# Patient Record
Sex: Female | Born: 1995 | Race: White | Hispanic: No | Marital: Married | Smoking: Former smoker
Health system: Southern US, Community
[De-identification: ages and names within clinical notes are randomized; demographics above are authoritative.]

## PROBLEM LIST (undated history)

## (undated) ENCOUNTER — Inpatient Hospital Stay: Payer: Self-pay

## (undated) DIAGNOSIS — E7800 Pure hypercholesterolemia, unspecified: Secondary | ICD-10-CM

## (undated) DIAGNOSIS — K760 Fatty (change of) liver, not elsewhere classified: Secondary | ICD-10-CM

## (undated) DIAGNOSIS — I951 Orthostatic hypotension: Secondary | ICD-10-CM

## (undated) DIAGNOSIS — F32A Depression, unspecified: Secondary | ICD-10-CM

## (undated) DIAGNOSIS — L309 Dermatitis, unspecified: Secondary | ICD-10-CM

## (undated) DIAGNOSIS — R519 Headache, unspecified: Secondary | ICD-10-CM

## (undated) DIAGNOSIS — R011 Cardiac murmur, unspecified: Secondary | ICD-10-CM

## (undated) DIAGNOSIS — F329 Major depressive disorder, single episode, unspecified: Secondary | ICD-10-CM

## (undated) DIAGNOSIS — R51 Headache: Secondary | ICD-10-CM

## (undated) DIAGNOSIS — J45909 Unspecified asthma, uncomplicated: Secondary | ICD-10-CM

## (undated) DIAGNOSIS — F988 Other specified behavioral and emotional disorders with onset usually occurring in childhood and adolescence: Secondary | ICD-10-CM

## (undated) HISTORY — PX: TONSILLECTOMY AND ADENOIDECTOMY: SUR1326

## (undated) HISTORY — DX: Headache, unspecified: R51.9

## (undated) HISTORY — DX: Pure hypercholesterolemia, unspecified: E78.00

## (undated) HISTORY — DX: Depression, unspecified: F32.A

## (undated) HISTORY — PX: ADENOIDECTOMY: SUR15

## (undated) HISTORY — DX: Headache: R51

## (undated) HISTORY — DX: Major depressive disorder, single episode, unspecified: F32.9

---
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
## 2015-01-01 ENCOUNTER — Encounter: Payer: Self-pay | Admitting: Certified Nurse Midwife

## 2015-01-01 DIAGNOSIS — M549 Dorsalgia, unspecified: Secondary | ICD-10-CM | POA: Diagnosis present

## 2015-01-01 DIAGNOSIS — O9989 Other specified diseases and conditions complicating pregnancy, childbirth and the puerperium: Secondary | ICD-10-CM

## 2015-01-01 DIAGNOSIS — O2303 Infections of kidney in pregnancy, third trimester: Secondary | ICD-10-CM | POA: Diagnosis present

## 2015-01-01 DIAGNOSIS — O99891 Other specified diseases and conditions complicating pregnancy: Secondary | ICD-10-CM | POA: Diagnosis present

## 2015-01-01 LAB — CBC
HCT: 34.4 % — ABNORMAL LOW (ref 35.0–47.0)
HEMOGLOBIN: 11.7 g/dL — AB (ref 12.0–16.0)
MCH: 29 pg (ref 26.0–34.0)
MCHC: 34 g/dL (ref 32.0–36.0)
MCV: 85.2 fL (ref 80.0–100.0)
Platelets: 227 10*3/uL (ref 150–440)
RBC: 4.03 MIL/uL (ref 3.80–5.20)
RDW: 12.4 % (ref 11.5–14.5)
WBC: 12.8 10*3/uL — ABNORMAL HIGH (ref 3.6–11.0)

## 2015-01-01 LAB — BASIC METABOLIC PANEL
Anion gap: 10 (ref 5–15)
BUN: 6 mg/dL (ref 6–20)
CHLORIDE: 106 mmol/L (ref 101–111)
CO2: 21 mmol/L — ABNORMAL LOW (ref 22–32)
Calcium: 9.4 mg/dL (ref 8.9–10.3)
Creatinine, Ser: 0.48 mg/dL (ref 0.44–1.00)
GFR calc Af Amer: >60 mL/min (ref >60)
GFR calc non Af Amer: >60 mL/min (ref >60)
GLUCOSE: 75 mg/dL (ref 65–99)
POTASSIUM: 3.7 mmol/L (ref 3.5–5.1)
SODIUM: 137 mmol/L (ref 135–145)

## 2015-01-01 MED ORDER — AZTREONAM 1 G IJ SOLR
1.0000 g | Freq: Three times a day (TID) | INTRAMUSCULAR | Status: DC
  Filled 2015-01-01 (×4): qty 1

## 2015-01-01 MED ORDER — OXYCODONE-ACETAMINOPHEN 5-325 MG PO TABS: 1.0000 | ORAL_TABLET | Freq: Four times a day (QID) | ORAL | Status: DC | PRN

## 2015-01-01 MED ORDER — AZTREONAM 1 G IJ SOLR
1.0000 g | Freq: Three times a day (TID) | INTRAMUSCULAR | Status: DC
  Filled 2015-01-01 (×5): qty 1

## 2015-01-01 MED ORDER — DEXTROSE 5 % IV SOLN
1.0000 g | Freq: Three times a day (TID) | INTRAVENOUS | Status: DC
  Administered 2015-01-01 – 2015-01-02 (×4): 1 g via INTRAVENOUS
  Filled 2015-01-01 (×7): qty 1

## 2015-01-01 MED ORDER — LACTATED RINGERS IV SOLN
INTRAVENOUS | Status: DC
  Administered 2015-01-01 (×2): via INTRAVENOUS
  Administered 2015-01-01: 125 mL/h via INTRAVENOUS
  Administered 2015-01-02 (×2): via INTRAVENOUS
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
## 2015-01-01 NOTE — Progress Notes (Addendum)
Patient transferred from L&D; report given via telephone after patient was dropped off.  IV located in left hand but not infusing since transfer.  IV pole was located outside patient's door and IV tubing was clamped shut with fluid and antibiotic bags hanging on the bed pole.  IV site required flushing as it was clotted and occluded when hooked up to the IV pump.  Flushing occurred after two attempts and infusion of LR @ 125/hr was started.  Patient is not listed as an observation patient for antepartum.  Orders still show labor & delivery status after transfer and there are no antepartum orders.  L&D nurse was made aware of this during report.
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
## 2015-01-01 NOTE — Progress Notes (Signed)
Report given to Antepartum RN.

## 2015-01-01 NOTE — Progress Notes (Signed)
Pt. Transferred to antepartum for anbx therapy, via WC with FOB at the bedside. Fetal monitoring complete, IV located in left hand with LR infusing at 125 ml/hr, one dose of Azactam 1g given at 0149.  Pt. Up to BR at will. Voiding without problems.

## 2015-01-02 DIAGNOSIS — O2303 Infections of kidney in pregnancy, third trimester: Secondary | ICD-10-CM | POA: Diagnosis not present

## 2015-01-02 LAB — URINE CULTURE: Culture: >=100000

## 2015-01-02 MED ORDER — NITROFURANTOIN MONOHYD MACRO 100 MG PO CAPS: 100.0000 mg | ORAL_CAPSULE | Freq: Two times a day (BID) | ORAL | Status: DC

## 2015-01-02 NOTE — Progress Notes (Signed)
FHT 144

## 2015-01-02 NOTE — Progress Notes (Signed)
All discharge instructions given to patient and she voices understanding of all instructions given.   Iv d/c'd pt tolerated well, site benign. She will make her f/u appt in 1 wk with [facility]. Prescription given.  Patient discharged home with significant other escorted out by auxillary

## 2015-01-02 NOTE — Progress Notes (Deleted)
HR 144

## 2015-01-02 NOTE — Discharge Summary (Signed)
Physician Discharge Summary  Patient ID: Madison SalvoChelsea N Bergquist MRN: [id number] DOB/AGE: 03/24/1995 19 y.o.  Admit date: 12/31/2014 Discharge date: 01/02/2015  Admission Diagnoses:  Discharge Diagnoses:  Active Problems:   Pyelonephritis affecting pregnancy in third trimester, antepartum   Back pain affecting pregnancy in third trimester   Discharged Condition: stable  Hospital Course: IV antibiotics Aztreonam x 24 hours, Lactated Ringers, Percocet for pain  Consults: None  Significant Diagnostic Studies: labs: CBC 01/02/15: WBC elevated at 12.8, Hgb mildly decreased at 11.7, Hct mildly decreased at 34.4. UA 01/01/15: hazy, many bacteria, 3+ Leukocytes, Protein 100, WBC too numerous to count. Results of Sensitivity pending   Discharge Exam: Blood pressure 100/60, pulse 92, temperature 98.2 F (36.8 C), temperature source Oral, resp. rate 20, last menstrual period 05/17/2014, SpO2 98 %.  General exam: Alert and oriented x3 in NAD No CVA tenderness + fetal movement  Disposition: 01-Home or Self Care     Medication List    TAKE these medications        cyclobenzaprine 10 MG tablet  Commonly known as:  FLEXERIL  Take 1 tablet (10 mg total) by mouth every 8 (eight) hours as needed for muscle spasms.     multivitamin-prenatal 27-0.8 MG Tabs tablet  Take 1 tablet by mouth daily at 12 noon.     nitrofurantoin (macrocrystal-monohydrate) 100 MG capsule  Commonly known as:  MACROBID  Take 1 capsule (100 mg total) by mouth 2 (two) times daily.           Follow-up Information    Follow up with GUTIERREZ, COLLEEN, CNM In 1 week.   Specialty:  Certified Nurse Midwife   Why:  for follow up   Contact information:   [address] [place] [address] [phone]       Signed: Tresea MallGLEDHILL,Dalya Maselli 01/02/2015, 10:30 AM          This patient and plan were discussed with Dr Tiburcio PeaHarris 01/02/15

## 2015-01-02 NOTE — Discharge Instructions (Signed)

## 2015-01-06 NOTE — L&D Delivery Note (Signed)
Delivery Note At  a viable and healthy female was delivered via  (Presentation: ;  ).  APGAR:8 ,8 ; weight  .  7#8oz Placenta status: delivered intact with 3 vessel  Cord:  with the following complications: NCx1, loose- infant delivered through it  Anesthesia:  Epidural-just placed Episiotomy:  none Lacerations:  bilateral perilabial  Suture Repair: 3.0 vicryl rapide Est. Blood Loss (mL):  500  Mom to postpartum.  Baby to Couplet care / Skin to Skin.  Imunique Samad NIKE, CNM 02/27/2015, 3:38 AM

## 2015-02-06 ENCOUNTER — Ambulatory Visit (INDEPENDENT_AMBULATORY_CARE_PROVIDER_SITE_OTHER): Payer: Medicaid Other | Admitting: Obstetrics and Gynecology

## 2015-02-06 ENCOUNTER — Encounter: Payer: Self-pay | Admitting: Obstetrics and Gynecology

## 2015-02-06 VITALS — BP 122/94 | HR 98 | Wt 166.7 lb

## 2015-02-06 DIAGNOSIS — Z331 Pregnant state, incidental: Secondary | ICD-10-CM

## 2015-02-06 LAB — POCT URINALYSIS DIPSTICK
BILIRUBIN UA: NEGATIVE
Blood, UA: NEGATIVE
GLUCOSE UA: NEGATIVE
KETONES UA: NEGATIVE
NITRITE UA: NEGATIVE
PH UA: 6
Spec Grav, UA: 1.01
Urobilinogen, UA: 0.2

## 2015-02-06 NOTE — Progress Notes (Signed)
NOB transfer from [facility] 37w 6d Pt c/o b feet swelling, "turn purple" sometimes,

## 2015-02-06 NOTE — Patient Instructions (Signed)

## 2015-02-06 NOTE — Progress Notes (Signed)
ROB-doing well, occasional BHC, labor precautions discussed

## 2015-02-13 ENCOUNTER — Encounter: Payer: Self-pay | Admitting: Obstetrics and Gynecology

## 2015-02-13 ENCOUNTER — Encounter: Payer: Medicaid Other | Admitting: Obstetrics and Gynecology

## 2015-02-13 ENCOUNTER — Ambulatory Visit (INDEPENDENT_AMBULATORY_CARE_PROVIDER_SITE_OTHER): Payer: Medicaid Other | Admitting: Obstetrics and Gynecology

## 2015-02-13 ENCOUNTER — Other Ambulatory Visit: Payer: Self-pay | Admitting: Obstetrics and Gynecology

## 2015-02-13 VITALS — BP 119/78 | HR 93 | Wt 166.7 lb

## 2015-02-13 DIAGNOSIS — Z331 Pregnant state, incidental: Secondary | ICD-10-CM

## 2015-02-13 LAB — POCT URINALYSIS DIPSTICK
BILIRUBIN UA: NEGATIVE
Blood, UA: NEGATIVE
Glucose, UA: NEGATIVE
KETONES UA: NEGATIVE
Nitrite, UA: NEGATIVE
PH UA: 7
PROTEIN UA: NEGATIVE
SPEC GRAV UA: 1.01
Urobilinogen, UA: 0.2

## 2015-02-13 NOTE — Progress Notes (Signed)
ROB-pt denies complaints

## 2015-02-13 NOTE — Progress Notes (Signed)
ROB- labor precautions discussed, and postdate care reviewed.

## 2015-02-15 ENCOUNTER — Other Ambulatory Visit: Payer: Self-pay | Admitting: Obstetrics and Gynecology

## 2015-02-15 DIAGNOSIS — O480 Post-term pregnancy: Secondary | ICD-10-CM

## 2015-02-17 LAB — STREP GP B NAA: STREP GROUP B AG: NEGATIVE

## 2015-02-17 LAB — GC/CHLAMYDIA PROBE AMP
CHLAMYDIA, DNA PROBE: NEGATIVE
NEISSERIA GONORRHOEAE BY PCR: NEGATIVE

## 2015-02-20 ENCOUNTER — Encounter: Payer: Self-pay | Admitting: Obstetrics and Gynecology

## 2015-02-20 ENCOUNTER — Encounter: Payer: Self-pay | Admitting: *Deleted

## 2015-02-20 ENCOUNTER — Ambulatory Visit (INDEPENDENT_AMBULATORY_CARE_PROVIDER_SITE_OTHER): Payer: Medicaid Other | Admitting: Obstetrics and Gynecology

## 2015-02-20 VITALS — BP 130/85 | HR 106 | Wt 167.6 lb

## 2015-02-20 DIAGNOSIS — Z331 Pregnant state, incidental: Secondary | ICD-10-CM

## 2015-02-20 LAB — POCT URINALYSIS DIPSTICK
Bilirubin, UA: NEGATIVE
Blood, UA: NEGATIVE
Glucose, UA: NEGATIVE
KETONES UA: NEGATIVE
Nitrite, UA: NEGATIVE
PH UA: 6.5
PROTEIN UA: NEGATIVE
SPEC GRAV UA: 1.01
Urobilinogen, UA: 0.2

## 2015-02-20 NOTE — Progress Notes (Signed)
ROB-pt c/o low back pain

## 2015-02-20 NOTE — Progress Notes (Signed)
ROB- doing well. 

## 2015-02-22 ENCOUNTER — Ambulatory Visit (INDEPENDENT_AMBULATORY_CARE_PROVIDER_SITE_OTHER): Payer: Medicaid Other

## 2015-02-22 ENCOUNTER — Ambulatory Visit (INDEPENDENT_AMBULATORY_CARE_PROVIDER_SITE_OTHER): Payer: Medicaid Other | Admitting: Obstetrics and Gynecology

## 2015-02-22 ENCOUNTER — Encounter: Payer: Self-pay | Admitting: Obstetrics and Gynecology

## 2015-02-22 VITALS — BP 127/75 | HR 93 | Wt 169.3 lb

## 2015-02-22 DIAGNOSIS — Z3493 Encounter for supervision of normal pregnancy, unspecified, third trimester: Secondary | ICD-10-CM | POA: Diagnosis not present

## 2015-02-22 DIAGNOSIS — O480 Post-term pregnancy: Secondary | ICD-10-CM

## 2015-02-22 LAB — POCT URINALYSIS DIPSTICK
Bilirubin, UA: NEGATIVE
Glucose, UA: NEGATIVE
KETONES UA: NEGATIVE
NITRITE UA: NEGATIVE
PH UA: 6.5
RBC UA: NEGATIVE
Spec Grav, UA: 1.02
Urobilinogen, UA: 0.2

## 2015-02-22 NOTE — Progress Notes (Signed)
Post dates- u/s and nst.

## 2015-02-22 NOTE — Progress Notes (Signed)
Indications:  Growth/AFI/BPP for Post Dates Findings:  Singleton intrauterine pregnancy is visualized with FHR at 131 BPM. Biometrics give an (U/S) Gestational age of [redacted] weeks 2 days, and an (U/S) EDD of 03/13/15; this DOES NOT correlate with the clinically established EDD of 02/21/15.  Fetal presentation is vertex, spine anterior.  EFW: 3164 grams ( 7 lbs. 0 oz. ) 29th percentile for growth Mayford Knife). Placenta: Posterior, grade 3. AFI: Adequate at 9.1 cm. BPP: 6/8  (no fetal breathing movements seen).  Anatomic survey of the stomach, bladder and kidneys is completed and appears WNL.     Impression: 1. 37 week 2 day Viable Singleton Intrauterine pregnancy by U/S. 2. (U/S) EDD IS NOT consistent with Clinically established (LMP) EDD of 02/21/15. 3. EFW is in the 29th percentile for growth Mayford Knife) at 3164 grams ( 7 lbs. 0 oz. ). 37 weeks 2 days ( should be 40 1/7). 4. BPP 6/8. No fetal breathing movements seen. 5. AFI is adequate at 9.1 cm.   NST performed today was reviewed and was found to be reactive. Baseline125-135 with Moderate variability; No decels noted.  Continue recommended antenatal testing and prenatal care.  Plan IOL 2/21 if not delivered by then

## 2015-02-23 ENCOUNTER — Observation Stay
Admission: EM | Admit: 2015-02-23 | Discharge: 2015-02-23 | Disposition: A | Discharge status: Home / Self Care | Payer: Medicaid Other | Attending: Obstetrics and Gynecology | Admitting: Obstetrics and Gynecology

## 2015-02-23 ENCOUNTER — Encounter: Payer: Self-pay | Admitting: *Deleted

## 2015-02-23 DIAGNOSIS — M549 Dorsalgia, unspecified: Secondary | ICD-10-CM | POA: Diagnosis present

## 2015-02-23 DIAGNOSIS — Z3A40 40 weeks gestation of pregnancy: Secondary | ICD-10-CM | POA: Insufficient documentation

## 2015-02-23 NOTE — Progress Notes (Signed)
Galen Manila CNM given update- CNM states that pt maybe d/c'ed home with Labor Precautions. Note pt scheduled for Induction of Labor this Tues.

## 2015-02-23 NOTE — Progress Notes (Signed)
Perineum, bed chuk pad appears dry. Nitrazine paper test negative to labia and inner labia

## 2015-02-23 NOTE — OB Triage Note (Signed)
Pt reports regular u/c's as back pain since approx 1900 today, every 5 min- pain occurring in lower back

## 2015-02-23 NOTE — Progress Notes (Signed)
Pt and spouse reviewed d/c instructions with RN, all concerns and questions discussed. Pt and spouse aware of impending [facility] for Induction of Labor Tues am 0800. Pt and spouse left [facility] walking with belongings.

## 2015-02-26 ENCOUNTER — Inpatient Hospital Stay
Admission: RE | Admit: 2015-02-26 | Discharge: 2015-02-28 | DRG: 775 | Disposition: A | Discharge status: Home / Self Care | Payer: Medicaid Other | Source: Ambulatory Visit | Attending: Obstetrics and Gynecology | Admitting: Obstetrics and Gynecology

## 2015-02-26 DIAGNOSIS — Z803 Family history of malignant neoplasm of breast: Secondary | ICD-10-CM | POA: Diagnosis not present

## 2015-02-26 DIAGNOSIS — Z3A40 40 weeks gestation of pregnancy: Secondary | ICD-10-CM

## 2015-02-26 DIAGNOSIS — Z880 Allergy status to penicillin: Secondary | ICD-10-CM | POA: Diagnosis not present

## 2015-02-26 DIAGNOSIS — Z87891 Personal history of nicotine dependence: Secondary | ICD-10-CM

## 2015-02-26 DIAGNOSIS — O365930 Maternal care for other known or suspected poor fetal growth, third trimester, not applicable or unspecified: Secondary | ICD-10-CM | POA: Diagnosis present

## 2015-02-26 DIAGNOSIS — O480 Post-term pregnancy: Secondary | ICD-10-CM | POA: Diagnosis present

## 2015-02-26 DIAGNOSIS — Z8041 Family history of malignant neoplasm of ovary: Secondary | ICD-10-CM | POA: Diagnosis not present

## 2015-02-26 DIAGNOSIS — O9081 Anemia of the puerperium: Secondary | ICD-10-CM | POA: Diagnosis not present

## 2015-02-26 LAB — CBC
HEMATOCRIT: 33.4 % — AB (ref 35.0–47.0)
Hemoglobin: 11 g/dL — ABNORMAL LOW (ref 12.0–16.0)
MCH: 26 pg (ref 26.0–34.0)
MCHC: 33 g/dL (ref 32.0–36.0)
MCV: 78.7 fL — ABNORMAL LOW (ref 80.0–100.0)
PLATELETS: 230 10*3/uL (ref 150–440)
RBC: 4.24 MIL/uL (ref 3.80–5.20)
RDW: 13.9 % (ref 11.5–14.5)
WBC: 8 10*3/uL (ref 3.6–11.0)

## 2015-02-26 LAB — TYPE AND SCREEN
ABO/RH(D): A POS
ANTIBODY SCREEN: NEGATIVE

## 2015-02-26 LAB — CHLAMYDIA/NGC RT PCR (ARMC ONLY)
CHLAMYDIA TR: NOT DETECTED
N GONORRHOEAE: NOT DETECTED

## 2015-02-26 MED ORDER — MISOPROSTOL 25 MCG QUARTER TABLET
50.0000 ug | ORAL_TABLET | ORAL | Status: DC
Start: 2015-02-26 — End: 2015-02-27
  Administered 2015-02-26 (×3): 50 ug via VAGINAL
  Filled 2015-02-26 (×2): qty 0.5
  Filled 2015-02-26: qty 1

## 2015-02-26 MED ORDER — LIDOCAINE HCL (PF) 1 % IJ SOLN
INTRAMUSCULAR | Status: AC
  Filled 2015-02-26: qty 30

## 2015-02-26 MED ORDER — AMMONIA AROMATIC IN INHA
RESPIRATORY_TRACT | Status: AC
  Filled 2015-02-26: qty 10

## 2015-02-26 MED ORDER — MISOPROSTOL 200 MCG PO TABS
ORAL_TABLET | ORAL | Status: AC
  Filled 2015-02-26: qty 4

## 2015-02-26 MED ORDER — OXYTOCIN 10 UNIT/ML IJ SOLN
INTRAMUSCULAR | Status: AC
  Filled 2015-02-26: qty 2

## 2015-02-26 MED ORDER — LIDOCAINE HCL (PF) 1 % IJ SOLN
30.0000 mL | INTRAMUSCULAR | Status: DC | PRN
Start: 2015-02-26 — End: 2015-02-27
  Administered 2015-02-27: 30 mL via SUBCUTANEOUS

## 2015-02-26 MED ORDER — LACTATED RINGERS IV SOLN
INTRAVENOUS | Status: DC
  Administered 2015-02-26 – 2015-02-27 (×3): via INTRAVENOUS

## 2015-02-26 MED ORDER — LACTATED RINGERS IV SOLN
500.0000 mL | INTRAVENOUS | Status: DC | PRN
  Administered 2015-02-27: 500 mL via INTRAVENOUS

## 2015-02-26 MED ORDER — FENTANYL CITRATE (PF) 100 MCG/2ML IJ SOLN
50.0000 ug | INTRAMUSCULAR | Status: DC | PRN
  Administered 2015-02-26 – 2015-02-27 (×2): 100 ug via INTRAVENOUS
  Filled 2015-02-26 (×2): qty 2

## 2015-02-26 MED ORDER — ACETAMINOPHEN 325 MG PO TABS
650.0000 mg | ORAL_TABLET | ORAL | Status: DC | PRN
  Administered 2015-02-27: 650 mg via ORAL
  Filled 2015-02-26: qty 2

## 2015-02-26 MED ORDER — ONDANSETRON HCL 4 MG/2ML IJ SOLN
4.0000 mg | Freq: Four times a day (QID) | INTRAMUSCULAR | Status: DC | PRN
  Filled 2015-02-26: qty 2

## 2015-02-26 MED ORDER — OXYTOCIN BOLUS FROM INFUSION
500.0000 mL | INTRAVENOUS | Status: DC
Start: 2015-02-26 — End: 2015-02-27

## 2015-02-26 MED ORDER — TERBUTALINE SULFATE 1 MG/ML IJ SOLN: 0.2500 mg | Freq: Once | INTRAMUSCULAR | Status: DC | PRN

## 2015-02-26 MED ORDER — OXYTOCIN 40 UNITS IN LACTATED RINGERS INFUSION - SIMPLE MED
2.5000 [IU]/h | INTRAVENOUS | Status: DC
  Filled 2015-02-26: qty 1000

## 2015-02-26 MED ORDER — CITRIC ACID-SODIUM CITRATE 334-500 MG/5ML PO SOLN: 30.0000 mL | ORAL | Status: DC | PRN

## 2015-02-26 MED ORDER — OXYTOCIN 40 UNITS IN LACTATED RINGERS INFUSION - SIMPLE MED
1.0000 m[IU]/min | INTRAVENOUS | Status: DC
  Administered 2015-02-26: 1 m[IU]/min via INTRAVENOUS
  Administered 2015-02-27: 666 m[IU]/min via INTRAVENOUS

## 2015-02-26 MED ORDER — SODIUM CHLORIDE FLUSH 0.9 % IV SOLN
INTRAVENOUS | Status: AC
  Filled 2015-02-26: qty 10

## 2015-02-26 NOTE — Progress Notes (Signed)
Madison Jenkins is a 20 y.o. G2P0010 at [redacted]w[redacted]d by LMP admitted for induction of labor due to Post dates. Due date 02/21/15. And SGA  Subjective: Reports some low back pain with contractions  Objective: BP 115/68 mmHg  Pulse 81  Temp(Src) 98.6 F (37 C) (Oral)  Resp 15  Ht  (1.727 m)  Wt 76.658 kg (169 lb)  BMI 25.70 kg/m2  LMP 05/17/2014   Total I/O In: 289.6 [I.V.:289.6] Out: -   FHT:  FHR: 121 bpm, variability: moderate,  accelerations:  Present,  decelerations:  Absent UC:   irregular, every 5-7 minutes, mild to palpation SVE:   Dilation: 1 Effacement (%): 60 Station: -1 Exam by:: JCM  Labs: Lab Results  Component Value Date   WBC 8.0 02/26/2015   HGB 11.0* 02/26/2015   HCT 33.4* 02/26/2015   MCV 78.7* 02/26/2015   PLT 230 02/26/2015    Assessment / Plan: Cytotec IOL, progressing well  Labor: Progressing normally Preeclampsia:  labs stable Fetal Wellbeing:  Category II Pain Control:  Labor support without medications I/D:  n/a Anticipated MOD:  NSVD  Devario Bucklew N Edon Hoadley, CNM 02/26/2015, 1:25 PM

## 2015-02-26 NOTE — Progress Notes (Signed)
Report received from Vallarie Mare, [place]. In room to assume care, bedside report completed. Pt lying in bed awake watching TV, s/o at side, supportive. Introduced self to pt and s/o. Discussed plan for shift. Will place Cytotec dose #3, repeat cervical exam and call CNM to update on progress. Pt asked about ambulating in hallway if not much change on exam. Questions addressed, understanding and agreement with plan verbalized

## 2015-02-26 NOTE — Progress Notes (Signed)
Spoke with Melody S. CNM, provided update to pt current labor status, cervix 2.5cm, still posterior and pt having difficulty tolerating exam. Cytotec dose #3 placed, pt asked about ambulating. Per CNM, pt may ambulate in an hour from time Cytotec placed, repeat cervical exam after ambulating and if cervix unchanged, will allow pt to rest tonight and resume induction process in AM, otherwise if change noted, start Pitocin per protocol.

## 2015-02-26 NOTE — Progress Notes (Signed)
2021 - Pt requesting to go to restroom for void and then ambulate since she already feels better sitting upright in bed, EFM removed, FHT reassuring, Category 1 tracing present, discussed s/s that pt should report if noted while ambulating. Pt provided with cup of ice chips, s/o present at side, pt into restroom and then will leave room to ambulate x30-45 mins.

## 2015-02-26 NOTE — H&P (Signed)
Obstetric History and Physical  Madison Jenkins is a 20 y.o. G2P0010 with IUP at [redacted]w[redacted]d presenting with IOL oders. Patient states she has been having  none contractions, none vaginal bleeding, intact membranes, with active fetal movement.    Prenatal Course Source of Care: [facility]  Pregnancy complications or risks:postdates, SGA,   Prenatal labs and studies: ABO, Rh: --/Positive/-- 08/09/2022 0000) Antibody: Negative 2022/08/09 0000) Rubella: Immune 08/09/2022 0000) RPR: Nonreactive (11/23 0000)  HBsAg: Negative 09-Aug-2022 0000)  HIV: Non-reactive (11/23 0000)  ZOX:[id number] (02/08 1500) 1 hr Glucola  normal Genetic screening normal Anatomy US normal  Past Medical History  Diagnosis Date  . Asthma   . Eczema   . Orthostatic hypotension   . Murmur   . ADD (attention deficit disorder)     ADHD as per pt    Past Surgical History  Procedure Laterality Date  . Tonsillectomy and adenoidectomy      OB History  Gravida Para Term Preterm AB SAB TAB Ectopic Multiple Living  2 0   1 1        # Outcome Date GA Lbr Len/2nd Weight Sex Delivery Anes PTL Lv  2 Current           1 SAB               Social History   Social History  . Marital Status: Married    Spouse Name: N/A  . Number of Children: N/A  . Years of Education: N/A   Social History Main Topics  . Smoking status: Former Smoker -- 0.50 packs/day for .5 years    Quit date: 02/22/2014  . Smokeless tobacco: Never Used  . Alcohol Use: No  . Drug Use: No  . Sexual Activity: Yes   Other Topics Concern  . Not on file   Social History Narrative    Family History  Problem Relation Age of Onset  . Breast cancer Paternal Grandmother   . Cancer - Other Mother     ovarian    Prescriptions prior to admission  Medication Sig Dispense Refill Last Dose  . Prenatal Vit-Fe Fumarate-FA (MULTIVITAMIN-PRENATAL) 27-0.8 MG TABS tablet Take 1 tablet by mouth daily at 12 noon.   Past Week at Unknown time    Allergies  Allergen Reactions  .  Penicillins Anaphylaxis  . Amoxicillin Rash    Review of Systems: Negative except for what is mentioned in HPI.  Physical Exam: LMP 05/17/2014 GENERAL: Well-developed, well-nourished female in no acute distress.  LUNGS: Clear to auscultation bilaterally.  HEART: Regular rate and rhythm. ABDOMEN: Soft, nontender, nondistended, gravid. EXTREMITIES: Nontender, no edema, 2+ distal pulses. Cervical Exam:  1.5/80/-2 on last exam    Pertinent Labs/Studies:   No results found for this or any previous visit (from the past 24 hour(s)).  Assessment : Madison Jenkins is a 20 y.o. G2P0010 at [redacted]w[redacted]d being admitted for labor.  Plan: Labor:  Induction/Augmentation , per protocol, will start with cytotec. FWB: Reassuring fetal heart tracing.  GBS negative Delivery plan: Hopeful for vaginal delivery  Moniqua Engebretsen, CNM [facility]

## 2015-02-26 NOTE — Progress Notes (Signed)
Spoke with Galen Manila, CNM, made aware of pt recent exam with change noted, cervix now 3/80/-1. Discussed starting Pitocin in couple hour from time last dose Cytotec placed. Pt feeling increased pain during contractions but tolerating well and would like to wait for IV pain med. Pitocin to start at 2330 per protocol.

## 2015-02-26 NOTE — Progress Notes (Signed)
Madison Jenkins is a 20 y.o. G2P0010 at [redacted]w[redacted]d by LMP admitted for induction of labor due to postdates and SGA.  Subjective: Patient reports tolerating pain with contractions, and declines pain meds at this time  Objective: BP 130/82 mmHg  Pulse 95  Temp(Src) 98.3 F (36.8 C) (Oral)  Resp 183  Ht  (1.727 m)  Wt 76.658 kg (169 lb)  BMI 25.70 kg/m2  LMP 05/17/2014 I/O last 3 completed shifts: In: 1164.6 [I.V.:1164.6] Out: -  Total I/O In: 250 [I.V.:250] Out: -   FHT:  FHR: 125 bpm, variability: moderate,  accelerations:  Present,  decelerations:  Absent UC:   irregular, every 2-4 minutes SVE:   Dilation: 3 Effacement (%): 80 Station: -1 Exam by:: N.Sullivan, RN  Labs: Lab Results  Component Value Date   WBC 8.0 02/26/2015   HGB 11.0* 02/26/2015   HCT 33.4* 02/26/2015   MCV 78.7* 02/26/2015   PLT 230 02/26/2015    Assessment / Plan: cytotec IOL, progressing well, will convert to pitocinin 2 hours.  Labor: Progressing normally Preeclampsia:  labs stable Fetal Wellbeing:  Category I Pain Control:  Labor support without medications I/D:  n/a Anticipated MOD:  NSVD  Jari Carollo Suzan Nailer, CNM 02/26/2015, 9:35 PM

## 2015-02-27 ENCOUNTER — Inpatient Hospital Stay: Payer: Medicaid Other | Admitting: Anesthesiology

## 2015-02-27 LAB — CBC
HCT: 24.2 % — ABNORMAL LOW (ref 35.0–47.0)
HEMOGLOBIN: 8 g/dL — AB (ref 12.0–16.0)
MCH: 26 pg (ref 26.0–34.0)
MCHC: 33.3 g/dL (ref 32.0–36.0)
MCV: 78.3 fL — AB (ref 80.0–100.0)
Platelets: 192 10*3/uL (ref 150–440)
RBC: 3.09 MIL/uL — AB (ref 3.80–5.20)
RDW: 14.2 % (ref 11.5–14.5)
WBC: 11.6 10*3/uL — ABNORMAL HIGH (ref 3.6–11.0)

## 2015-02-27 LAB — RPR: RPR: NONREACTIVE

## 2015-02-27 MED ORDER — SODIUM CHLORIDE 0.9% FLUSH
3.0000 mL | Freq: Four times a day (QID) | INTRAVENOUS | Status: DC
  Administered 2015-02-27 – 2015-02-28 (×2): 3 mL via INTRAVENOUS

## 2015-02-27 MED ORDER — OXYCODONE-ACETAMINOPHEN 5-325 MG PO TABS: 1.0000 | ORAL_TABLET | ORAL | Status: DC | PRN

## 2015-02-27 MED ORDER — SIMETHICONE 80 MG PO CHEW: 80.0000 mg | CHEWABLE_TABLET | ORAL | Status: DC | PRN

## 2015-02-27 MED ORDER — DIBUCAINE 1 % RE OINT: 1.0000 "application " | TOPICAL_OINTMENT | RECTAL | Status: DC | PRN

## 2015-02-27 MED ORDER — MISOPROSTOL 200 MCG PO TABS
200.0000 ug | ORAL_TABLET | Freq: Once | ORAL | Status: AC
  Administered 2015-02-27: 200 ug via RECTAL

## 2015-02-27 MED ORDER — IBUPROFEN 600 MG PO TABS
600.0000 mg | ORAL_TABLET | Freq: Four times a day (QID) | ORAL | Status: DC
  Administered 2015-02-27 – 2015-02-28 (×5): 600 mg via ORAL
  Filled 2015-02-27 (×5): qty 1

## 2015-02-27 MED ORDER — ACETAMINOPHEN 325 MG PO TABS: 650.0000 mg | ORAL_TABLET | ORAL | Status: DC | PRN

## 2015-02-27 MED ORDER — BUPIVACAINE HCL (PF) 0.25 % IJ SOLN
INTRAMUSCULAR | Status: DC | PRN
  Administered 2015-02-27: 5 mL via EPIDURAL

## 2015-02-27 MED ORDER — OXYCODONE-ACETAMINOPHEN 5-325 MG PO TABS
2.0000 | ORAL_TABLET | ORAL | Status: DC | PRN
  Administered 2015-02-27 – 2015-02-28 (×2): 2 via ORAL
  Filled 2015-02-27 (×2): qty 2

## 2015-02-27 MED ORDER — FERROUS SULFATE 325 (65 FE) MG PO TABS
325.0000 mg | ORAL_TABLET | Freq: Two times a day (BID) | ORAL | Status: DC
  Administered 2015-02-27 – 2015-02-28 (×2): 325 mg via ORAL
  Filled 2015-02-27 (×2): qty 1

## 2015-02-27 MED ORDER — SENNOSIDES-DOCUSATE SODIUM 8.6-50 MG PO TABS
2.0000 | ORAL_TABLET | ORAL | Status: DC
  Administered 2015-02-27: 2 via ORAL
  Filled 2015-02-27: qty 2

## 2015-02-27 MED ORDER — ZOLPIDEM TARTRATE 5 MG PO TABS: 5.0000 mg | ORAL_TABLET | Freq: Every evening | ORAL | Status: DC | PRN

## 2015-02-27 MED ORDER — LIDOCAINE-EPINEPHRINE (PF) 1.5 %-1:200000 IJ SOLN
INTRAMUSCULAR | Status: DC | PRN
  Administered 2015-02-27: 2 mL via EPIDURAL

## 2015-02-27 MED ORDER — LIDOCAINE HCL (PF) 1 % IJ SOLN
INTRAMUSCULAR | Status: DC | PRN
  Administered 2015-02-27: 1 mL via INTRADERMAL

## 2015-02-27 MED ORDER — BENZOCAINE-MENTHOL 20-0.5 % EX AERO
1.0000 "application " | INHALATION_SPRAY | CUTANEOUS | Status: DC | PRN
  Administered 2015-02-27 – 2015-02-28 (×2): 1 via TOPICAL
  Filled 2015-02-27 (×2): qty 56

## 2015-02-27 MED ORDER — ONDANSETRON HCL 4 MG/2ML IJ SOLN: 4.0000 mg | INTRAMUSCULAR | Status: DC | PRN

## 2015-02-27 MED ORDER — ONDANSETRON HCL 4 MG PO TABS: 4.0000 mg | ORAL_TABLET | ORAL | Status: DC | PRN

## 2015-02-27 MED ORDER — WITCH HAZEL-GLYCERIN EX PADS: 1.0000 "application " | MEDICATED_PAD | CUTANEOUS | Status: DC | PRN

## 2015-02-27 MED ORDER — DIPHENHYDRAMINE HCL 25 MG PO CAPS: 25.0000 mg | ORAL_CAPSULE | Freq: Four times a day (QID) | ORAL | Status: DC | PRN

## 2015-02-27 MED ORDER — LANOLIN HYDROUS EX OINT: TOPICAL_OINTMENT | CUTANEOUS | Status: DC | PRN

## 2015-02-27 MED ORDER — FENTANYL 2.5 MCG/ML W/ROPIVACAINE 0.2% IN NS 100 ML EPIDURAL INFUSION (ARMC-ANES)
EPIDURAL | Status: AC
  Administered 2015-02-27 (×2): 11 mL/h via EPIDURAL
  Filled 2015-02-27: qty 100

## 2015-02-27 NOTE — Anesthesia Procedure Notes (Signed)
Epidural Patient location during procedure: OB Start time: 02/27/2015 1:41 AM End time: 02/27/2015 2:10 AM  Staffing Anesthesiologist: Elijio Miles F Performed by: anesthesiologist   Preanesthetic Checklist Completed: patient identified, site marked, surgical consent, pre-op evaluation, timeout performed, IV checked, risks and benefits discussed, monitors and equipment checked and at surgeon's request  Epidural Patient position: sitting Prep: Betadine Patient monitoring: heart rate and blood pressure Approach: midline Location: L3-L4 Injection technique: LOR air and LOR saline  Needle:  Needle type: Tuohy  Needle gauge: 18 G Needle length: 9 cm Needle insertion depth: 5 cm Catheter type: closed end flexible Catheter size: 20 Guage Catheter at skin depth: 9 cm Test dose: 1.5% lidocaine with Epi 1:200 K and negative  Assessment Sensory level: T8  Additional Notes Reason for block:at surgeon's request

## 2015-02-27 NOTE — Anesthesia Preprocedure Evaluation (Signed)
Anesthesia Evaluation  Patient identified by MRN, date of birth, ID band Patient awake    Reviewed: Allergy & Precautions, NPO status , Patient's Chart, lab work & pertinent test results  Airway Mallampati: II       Dental no notable dental hx.    Pulmonary asthma , former smoker,    breath sounds clear to auscultation       Cardiovascular Exercise Tolerance: Good  Rhythm:Regular Rate:Normal     Neuro/Psych Anxiety    GI/Hepatic negative GI ROS, Neg liver ROS,   Endo/Other  negative endocrine ROS  Renal/GU      Musculoskeletal negative musculoskeletal ROS (+)   Abdominal Normal abdominal exam  (+)   Peds  Hematology negative hematology ROS (+)   Anesthesia Other Findings   Reproductive/Obstetrics                             Anesthesia Physical Anesthesia Plan  ASA: II  Anesthesia Plan: Epidural   Post-op Pain Management:    Induction:   Airway Management Planned: Natural Airway  Additional Equipment:   Intra-op Plan:   Post-operative Plan:   Informed Consent: I have reviewed the patients History and Physical, chart, labs and discussed the procedure including the risks, benefits and alternatives for the proposed anesthesia with the patient or authorized representative who has indicated his/her understanding and acceptance.     Plan Discussed with:   Anesthesia Plan Comments:         Anesthesia Quick Evaluation

## 2015-02-27 NOTE — Progress Notes (Signed)
Epidural cath removed, blue dot intact. Site wnl, bandaid applied. Pt tolerating well.

## 2015-02-27 NOTE — Progress Notes (Signed)
SVD 0306, female infant. Pt remains stable post delivery, perineal soreness and swelling evident, encouraged ice pack and provided pt with dermoplast spray. Small amt lochia rubra, occasional small clots, Cytotec placed rectal per CNM following delivery at 0314. Pt denies dizziness with sitting upright in bed or difficulty upon standing. Minimal discomfort with fundal massage, Motrin  given at 0530a. Pt assisted with getting into [facility] for transfer to M/B unit, off [facility] unit @ 0610. Family and s/o with baby in bassinet into M/B room @ [phone]. Report to B. Juanetta Gosling, RN. Care relinquished.

## 2015-02-27 NOTE — Progress Notes (Signed)
RN at bedside to assess pt, s/o reports pt thinks her water broke @ 0055, covers pulled back, pt grossly ruptured, clear fluid, bed pad and gown wet. Pt says pains with contractions are getting worse, requesting Epidural. LR bolus started. Gown and bed pad changed.

## 2015-02-27 NOTE — Progress Notes (Signed)
Dr Darleene Cleaver in room to place Epidural. Pt assisted into position. LR bolus complete, procedure explained to pt. s/o standing in front of pt, supportive and coaching pt on breathing through contractions.  0148 - timeout performed for procedure. Consent signed and in chart 0150 - Epidural cath placed, pt tolerating procedure well 0151 - 5cc of 0.25% marcaine injected for loading bolus dose 0153  Pt able to wiggle all toes and responding appropriately. 2cc of 1.5 % lidocaine with epi injected for test dose 0155 - Epidural dressing placed  0203 - pump setup and started at 11cc/hr via alaris pump

## 2015-02-27 NOTE — Progress Notes (Signed)
Patients labs came back this afternoon and RN reported HGB and HCT to Melody.  Patient recently up to bsc and voided and was no longer dizzy.  Melody asked that if patient has any more dizziness or symptomatic to call her and she may order blood but right now there are no plans to. RN will pass this onto next shift. Currently patient is not symptomatic

## 2015-02-28 ENCOUNTER — Telehealth: Payer: Self-pay | Admitting: *Deleted

## 2015-02-28 LAB — CBC
HCT: 22.9 % — ABNORMAL LOW (ref 35.0–47.0)
Hemoglobin: 7.5 g/dL — ABNORMAL LOW (ref 12.0–16.0)
MCH: 26.3 pg (ref 26.0–34.0)
MCHC: 32.9 g/dL (ref 32.0–36.0)
MCV: 79.9 fL — ABNORMAL LOW (ref 80.0–100.0)
PLATELETS: 172 10*3/uL (ref 150–440)
RBC: 2.87 MIL/uL — AB (ref 3.80–5.20)
RDW: 14 % (ref 11.5–14.5)
WBC: 10.3 10*3/uL (ref 3.6–11.0)

## 2015-02-28 MED ORDER — MEDROXYPROGESTERONE ACETATE 150 MG/ML IM SUSP: 150.0000 mg | INTRAMUSCULAR | Status: DC

## 2015-02-28 MED ORDER — FUSION PLUS PO CAPS: 1.0000 | ORAL_CAPSULE | Freq: Every day | ORAL | Status: DC

## 2015-02-28 MED ORDER — VITAMIN D 50 MCG (2000 UT) PO CAPS: 1.0000 | ORAL_CAPSULE | Freq: Every day | ORAL | Status: DC

## 2015-02-28 MED ORDER — AMMONIA AROMATIC IN INHA
RESPIRATORY_TRACT | Status: AC
  Filled 2015-02-28: qty 10

## 2015-02-28 NOTE — Progress Notes (Signed)
Patient understands all discharge instructions and the need to make follow up appointments. Patient discharge via wheelchair with auxillary. 

## 2015-02-28 NOTE — Discharge Summary (Signed)
Obstetric Discharge Summary Reason for Admission: induction of labor Prenatal Procedures: NST and ultrasound Intrapartum Procedures: spontaneous vaginal delivery and hemorrhage Postpartum Procedures: none Complications-Operative and Postpartum: bilateral labial laceration HEMOGLOBIN  Date Value Ref Range Status  02/28/2015 7.5* 12.0 - 16.0 g/dL Final   HGB  Date Value Ref Range Status  11/23/2013 14.0 12.0-16.0 g/dL Final   HCT  Date Value Ref Range Status  02/28/2015 22.9* 35.0 - 47.0 % Final  11/23/2013 42.6 35.0-47.0 % Final    Physical Exam:  General: alert, cooperative, appears stated age and pale Lochia: appropriate Uterine Fundus: firm Incision: NA DVT Evaluation: No evidence of DVT seen on physical exam. Negative Homan's sign.  Discharge Diagnoses: Term Pregnancy-delivered and PP anemia  Discharge Information: Date: 02/28/2015 Activity: pelvic rest Diet: routine Medications: PNV, Ibuprofen, Iron and Depo to be given at 4 weeks Condition: stable Instructions: refer to practice specific booklet Discharge to: home   Newborn Data: Live born female  Birth Weight: 7 lb 8.6 oz (3420 g) APGAR: 8, 8  Home with mother.  Madison Jenkins N Mahlet Jergens,CNM 02/28/2015, 12:46 PM

## 2015-02-28 NOTE — Telephone Encounter (Signed)
Needed note for court, was done and mailed to pt

## 2015-02-28 NOTE — Anesthesia Postprocedure Evaluation (Signed)
Anesthesia Post Note  Patient: Madison Jenkins  Procedure(s) Performed: * No procedures listed *  Patient location during evaluation: Mother Baby Anesthesia Type: Epidural Level of consciousness: awake, awake and alert and oriented Pain management: pain level not controlled Vital Signs Assessment: post-procedure vital signs reviewed and stable Respiratory status: spontaneous breathing and nonlabored ventilation Cardiovascular status: blood pressure returned to baseline Postop Assessment: no headache, no backache, patient able to bend at knees and no signs of nausea or vomiting Anesthetic complications: no Comments: Patient states epidural did not work until after baby born. States epidural put her into labor said nurse was aware but no time to do anything.    Last Vitals:  Filed Vitals:   02/27/15 1958 02/27/15 2259  BP: 118/67 118/63  Pulse: 94 85  Temp: 36.9 C 36.9 C  Resp:      Last Pain:  Filed Vitals:   02/28/15 0729  PainSc: 2                  Ginger Carne

## 2015-03-04 ENCOUNTER — Telehealth: Payer: Self-pay | Admitting: Obstetrics and Gynecology

## 2015-03-04 NOTE — Telephone Encounter (Signed)
Just delivered Wednesday and has a question about bleeding.

## 2015-03-04 NOTE — Telephone Encounter (Signed)
Spoke with pt about her bleeding

## 2015-03-28 ENCOUNTER — Encounter: Payer: Self-pay | Admitting: Obstetrics and Gynecology

## 2015-03-28 ENCOUNTER — Ambulatory Visit (INDEPENDENT_AMBULATORY_CARE_PROVIDER_SITE_OTHER): Payer: Medicaid Other | Admitting: Obstetrics and Gynecology

## 2015-03-28 VITALS — BP 118/68 | HR 72 | Ht 68.0 in | Wt 151.0 lb

## 2015-03-28 DIAGNOSIS — Z30013 Encounter for initial prescription of injectable contraceptive: Secondary | ICD-10-CM

## 2015-03-28 LAB — POCT URINE PREGNANCY: Preg Test, Ur: NEGATIVE

## 2015-03-28 MED ORDER — MEDROXYPROGESTERONE ACETATE 150 MG/ML IM SUSP
150.0000 mg | Freq: Once | INTRAMUSCULAR | Status: AC
  Administered 2015-03-28: 150 mg via INTRAMUSCULAR

## 2015-03-28 NOTE — Progress Notes (Signed)
Patient ID: Madison SalvoChelsea N Suddreth, female   DOB: 1995/12/24, 20 y.o.   MRN: [id number] Pt is here for her first depo provera inj Vaginal delivery 02/27/15

## 2015-04-09 ENCOUNTER — Ambulatory Visit: Payer: Medicaid Other | Admitting: Obstetrics and Gynecology

## 2015-04-18 ENCOUNTER — Encounter: Payer: Self-pay | Admitting: Emergency Medicine

## 2015-04-18 ENCOUNTER — Emergency Department
Admission: EM | Admit: 2015-04-18 | Discharge: 2015-04-18 | Disposition: A | Discharge status: Home / Self Care | Payer: No Typology Code available for payment source | Attending: Emergency Medicine | Admitting: Emergency Medicine

## 2015-04-18 DIAGNOSIS — Z87891 Personal history of nicotine dependence: Secondary | ICD-10-CM | POA: Insufficient documentation

## 2015-04-18 DIAGNOSIS — J45909 Unspecified asthma, uncomplicated: Secondary | ICD-10-CM | POA: Insufficient documentation

## 2015-04-18 DIAGNOSIS — Y92410 Unspecified street and highway as the place of occurrence of the external cause: Secondary | ICD-10-CM | POA: Diagnosis not present

## 2015-04-18 DIAGNOSIS — M542 Cervicalgia: Secondary | ICD-10-CM | POA: Diagnosis present

## 2015-04-18 DIAGNOSIS — S134XXA Sprain of ligaments of cervical spine, initial encounter: Secondary | ICD-10-CM | POA: Insufficient documentation

## 2015-04-18 DIAGNOSIS — Y999 Unspecified external cause status: Secondary | ICD-10-CM | POA: Diagnosis not present

## 2015-04-18 DIAGNOSIS — Y939 Activity, unspecified: Secondary | ICD-10-CM | POA: Insufficient documentation

## 2015-04-18 MED ORDER — CARISOPRODOL 350 MG PO TABS: 350.0000 mg | ORAL_TABLET | Freq: Three times a day (TID) | ORAL | Status: DC | PRN
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
## 2015-04-18 NOTE — Discharge Instructions (Signed)
Cervical Sprain  A cervical sprain is an injury in the neck in which the strong, fibrous tissues (ligaments) that connect your neck bones stretch or tear. Cervical sprains can range from mild to severe. Severe cervical sprains can cause the neck vertebrae to be unstable. This can lead to damage of the spinal cord and can result in serious nervous system problems. The amount of time it takes for a cervical sprain to get better depends on the cause and extent of the injury. Most cervical sprains heal in 1 to 3 weeks.  CAUSES   Severe cervical sprains may be caused by:    Contact sport injuries (such as from football, rugby, wrestling, hockey, auto racing, gymnastics, diving, martial arts, or boxing).    Motor vehicle collisions.    Whiplash injuries. This is an injury from a sudden forward and backward whipping movement of the head and neck.   Falls.   Mild cervical sprains may be caused by:    Being in an awkward position, such as while cradling a telephone between your ear and shoulder.    Sitting in a chair that does not offer proper support.    Working at a poorly designed computer station.    Looking up or down for long periods of time.   SYMPTOMS    Pain, soreness, stiffness, or a burning sensation in the front, back, or sides of the neck. This discomfort may develop immediately after the injury or slowly, 24 hours or more after the injury.    Pain or tenderness directly in the middle of the back of the neck.    Shoulder or upper back pain.    Limited ability to move the neck.    Headache.    Dizziness.    Weakness, numbness, or tingling in the hands or arms.    Muscle spasms.    Difficulty swallowing or chewing.    Tenderness and swelling of the neck.   DIAGNOSIS   Most of the time your health care provider can diagnose a cervical sprain by taking your history and doing a physical exam. Your health care provider will ask about previous neck injuries and any known neck  problems, such as arthritis in the neck. X-rays may be taken to find out if there are any other problems, such as with the bones of the neck. Other tests, such as a CT scan or MRI, may also be needed.   TREATMENT   Treatment depends on the severity of the cervical sprain. Mild sprains can be treated with rest, keeping the neck in place (immobilization), and pain medicines. Severe cervical sprains are immediately immobilized. Further treatment is done to help with pain, muscle spasms, and other symptoms and may include:   Medicines, such as pain relievers, numbing medicines, or muscle relaxants.    Physical therapy. This may involve stretching exercises, strengthening exercises, and posture training. Exercises and improved posture can help stabilize the neck, strengthen muscles, and help stop symptoms from returning.   HOME CARE INSTRUCTIONS    Put ice on the injured area.     Put ice in a plastic bag.     Place a towel between your skin and the bag.     Leave the ice on for 15-20 minutes, 3-4 times a day.    If your injury was severe, you may have been given a cervical collar to wear. A cervical collar is a two-piece collar designed to keep your neck from moving while it heals.      Do not remove the collar unless instructed by your health care provider.    If you have long hair, keep it outside of the collar.    Ask your health care provider before making any adjustments to your collar. Minor adjustments may be required over time to improve comfort and reduce pressure on your chin or on the back of your head.    Ifyou are allowed to remove the collar for cleaning or bathing, follow your health care provider's instructions on how to do so safely.    Keep your collar clean by wiping it with mild soap and water and drying it completely. If the collar you have been given includes removable pads, remove them every 1-2 days and hand wash them with soap and water. Allow them to air dry. They should be completely  dry before you wear them in the collar.    If you are allowed to remove the collar for cleaning and bathing, wash and dry the skin of your neck. Check your skin for irritation or sores. If you see any, tell your health care provider.    Do not drive while wearing the collar.    Only take over-the-counter or prescription medicines for pain, discomfort, or fever as directed by your health care provider.    Keep all follow-up appointments as directed by your health care provider.    Keep all physical therapy appointments as directed by your health care provider.    Make any needed adjustments to your workstation to promote good posture.    Avoid positions and activities that make your symptoms worse.    Warm up and stretch before being active to help prevent problems.   SEEK MEDICAL CARE IF:    Your pain is not controlled with medicine.    You are unable to decrease your pain medicine over time as planned.    Your activity level is not improving as expected.   SEEK IMMEDIATE MEDICAL CARE IF:    You develop any bleeding.   You develop stomach upset.   You have signs of an allergic reaction to your medicine.    Your symptoms get worse.    You develop new, unexplained symptoms.    You have numbness, tingling, weakness, or paralysis in any part of your body.   MAKE SURE YOU:    Understand these instructions.   Will watch your condition.   Will get help right away if you are not doing well or get worse.     This information is not intended to replace advice given to you by your health care provider. Make sure you discuss any questions you have with your health care provider.     Document Released: 10/19/2006 Document Revised: 12/27/2012 Document Reviewed: 06/29/2012  Elsevier Interactive Patient Education 2016 [employer]
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
## 2015-04-24 ENCOUNTER — Encounter: Payer: Self-pay | Admitting: Obstetrics and Gynecology

## 2015-04-24 ENCOUNTER — Ambulatory Visit (INDEPENDENT_AMBULATORY_CARE_PROVIDER_SITE_OTHER): Payer: Medicaid Other | Admitting: Obstetrics and Gynecology

## 2015-04-24 DIAGNOSIS — R1013 Epigastric pain: Secondary | ICD-10-CM

## 2015-04-24 LAB — POCT URINE PREGNANCY: Preg Test, Ur: NEGATIVE

## 2015-04-24 NOTE — Progress Notes (Signed)
  Subjective:     Madison Jenkins is a 20 y.o. female who presents for a postpartum visit. She is 7 weeks postpartum following a spontaneous vaginal delivery. I have fully reviewed the prenatal and intrapartum course. The delivery was at 39 gestational weeks. Outcome: spontaneous vaginal delivery. Anesthesia: IV sedation. Postpartum course has been uncomplicated. Baby's course has been uncomplicated. Baby is feeding by formula. Bleeding no bleeding. Bowel function is normal. Bladder function is normal. Patient is sexually active. Contraception method is Depo-Provera injections. Postpartum depression screening: negative.  The following portions of the patient's history were reviewed and updated as appropriate: allergies, current medications, past family history, past medical history, past social history, past surgical history and problem list.  Review of Systems Gastrointestinal: positive for abdominal pain and cramping over gallbladder after eating x 2 weeks   Objective:    BP 112/75 mmHg  Pulse 95  Ht 5\' 8"  (1.727 m)  Wt 148 lb 6.4 oz (67.314 kg)  BMI 22.57 kg/m2  Breastfeeding? No  General:  alert, cooperative and appears stated age   Breasts:  inspection negative, no nipple discharge or bleeding, no masses or nodularity palpable  Lungs: clear to auscultation bilaterally  Heart:  regular rate and rhythm, S1, S2 normal, no murmur, click, rub or gallop  Abdomen: soft, non-tender; bowel sounds normal; no masses,  no organomegaly   Vulva:  normal  Vagina: normal vagina, no discharge, exudate, lesion, or erythema  Cervix:  multiparous appearance  Corpus: normal size, contour, position, consistency, mobility, non-tender  Adnexa:  normal adnexa and no mass, fullness, tenderness  Rectal Exam: Normal rectovaginal exam        Assessment:     7 weeks postpartum exam. Pap smear not done at today's visit.   Plan:    1. Contraception: Depo-Provera injections 2. epigastric pain, postprandal 3.  Follow up in: 8 weeks for pap and Depo or as needed.

## 2015-04-24 NOTE — Patient Instructions (Signed)
  Place postpartum visit patient instructions here.  

## 2015-04-25 LAB — CBC
HEMATOCRIT: 39.1 % (ref 34.0–46.6)
Hemoglobin: 12.7 g/dL (ref 11.1–15.9)
MCH: 24.5 pg — ABNORMAL LOW (ref 26.6–33.0)
MCHC: 32.5 g/dL (ref 31.5–35.7)
MCV: 76 fL — ABNORMAL LOW (ref 79–97)
PLATELETS: 260 10*3/uL (ref 150–379)
RBC: 5.18 x10E6/uL (ref 3.77–5.28)
RDW: 15.4 % (ref 12.3–15.4)
WBC: 4.9 10*3/uL (ref 3.4–10.8)

## 2015-04-25 LAB — IRON: IRON: 43 ug/dL (ref 27–159)

## 2015-04-25 LAB — VITAMIN D 25 HYDROXY (VIT D DEFICIENCY, FRACTURES): VIT D 25 HYDROXY: 32.5 ng/mL (ref 30.0–100.0)

## 2015-04-30 ENCOUNTER — Telehealth: Payer: Self-pay | Admitting: *Deleted

## 2015-04-30 NOTE — Telephone Encounter (Signed)
-----   Message from Purcell NailsMelody N Shambley, [place] sent at 04/25/2015  9:21 AM EDT ----- You are no longer anemic. No worries

## 2015-04-30 NOTE — Telephone Encounter (Signed)
Notified pt. 

## 2015-06-12 ENCOUNTER — Ambulatory Visit: Payer: Medicaid Other | Admitting: Obstetrics and Gynecology

## 2015-06-14 ENCOUNTER — Ambulatory Visit (INDEPENDENT_AMBULATORY_CARE_PROVIDER_SITE_OTHER): Payer: Medicaid Other | Admitting: Obstetrics and Gynecology

## 2015-06-14 VITALS — BP 107/66 | HR 91 | Ht 68.0 in | Wt 155.6 lb

## 2015-06-14 DIAGNOSIS — Z3042 Encounter for surveillance of injectable contraceptive: Secondary | ICD-10-CM | POA: Diagnosis not present

## 2015-06-14 DIAGNOSIS — R110 Nausea: Secondary | ICD-10-CM | POA: Diagnosis not present

## 2015-06-14 LAB — POCT URINE PREGNANCY: PREG TEST UR: NEGATIVE

## 2015-06-14 MED ORDER — MEDROXYPROGESTERONE ACETATE 150 MG/ML IM SUSP
150.0000 mg | Freq: Once | INTRAMUSCULAR | Status: AC
  Administered 2015-06-14: 150 mg via INTRAMUSCULAR

## 2015-06-14 NOTE — Progress Notes (Signed)
Date last pap: N/A Last Depo-Provera: 03/28/2015 Side Effects if any: None Serum HCG indicated? No, UPT done by pt's request as she has been nauseated Depo-Provera 150 mg IM given by: Dorita Fray. Derico Mitton, CMA, LT Buttocks, Pt tolerated well.  Next appointment due: August 25 - September 8

## 2015-06-14 NOTE — Patient Instructions (Signed)
Follow up in 3 mos or before if needed.  

## 2015-08-05 ENCOUNTER — Encounter: Payer: Self-pay | Admitting: Obstetrics and Gynecology

## 2015-08-05 ENCOUNTER — Ambulatory Visit: Payer: Medicaid Other | Admitting: Obstetrics and Gynecology

## 2015-08-05 VITALS — BP 77/51 | HR 81 | Ht 68.0 in | Wt 165.5 lb

## 2015-08-05 DIAGNOSIS — Z3042 Encounter for surveillance of injectable contraceptive: Secondary | ICD-10-CM

## 2015-08-05 NOTE — Progress Notes (Unsigned)
Pt is here today just wanting reassurance, she is currently on depo provera and hasn't had a period and was worried.  Reassured patient this can be normal

## 2015-08-06 LAB — BETA HCG QUANT (REF LAB)

## 2015-08-30 ENCOUNTER — Ambulatory Visit: Payer: Medicaid Other

## 2015-09-02 ENCOUNTER — Ambulatory Visit (INDEPENDENT_AMBULATORY_CARE_PROVIDER_SITE_OTHER): Payer: Medicaid Other | Admitting: Obstetrics and Gynecology

## 2015-09-02 ENCOUNTER — Ambulatory Visit: Payer: Medicaid Other

## 2015-09-02 VITALS — BP 103/69 | HR 99 | Ht 68.0 in | Wt 169.2 lb

## 2015-09-02 DIAGNOSIS — Z3042 Encounter for surveillance of injectable contraceptive: Secondary | ICD-10-CM | POA: Diagnosis not present

## 2015-09-02 MED ORDER — MEDROXYPROGESTERONE ACETATE 150 MG/ML IM SUSP
150.0000 mg | Freq: Once | INTRAMUSCULAR | Status: AC
  Administered 2015-09-02: 150 mg via INTRAMUSCULAR

## 2015-09-02 NOTE — Progress Notes (Signed)
Date last pap: n/a. Last Depo-Provera: 06/14/2015   Side Effects if any: btb occasionally. Serum HCG indicated? no Depo-Provera 150 mg IM given by: CMILLER. Next appointment due 11/13------------11/27.

## 2015-11-26 ENCOUNTER — Ambulatory Visit: Payer: Self-pay

## 2015-11-27 ENCOUNTER — Emergency Department
Admission: EM | Admit: 2015-11-27 | Discharge: 2015-11-27 | Disposition: A | Discharge status: Home / Self Care | Payer: No Typology Code available for payment source | Attending: Emergency Medicine | Admitting: Emergency Medicine

## 2015-11-27 ENCOUNTER — Emergency Department: Payer: No Typology Code available for payment source

## 2015-11-27 ENCOUNTER — Encounter: Payer: Self-pay | Admitting: Emergency Medicine

## 2015-11-27 DIAGNOSIS — Y92410 Unspecified street and highway as the place of occurrence of the external cause: Secondary | ICD-10-CM | POA: Insufficient documentation

## 2015-11-27 DIAGNOSIS — Y999 Unspecified external cause status: Secondary | ICD-10-CM | POA: Diagnosis not present

## 2015-11-27 DIAGNOSIS — J45909 Unspecified asthma, uncomplicated: Secondary | ICD-10-CM | POA: Diagnosis not present

## 2015-11-27 DIAGNOSIS — F17200 Nicotine dependence, unspecified, uncomplicated: Secondary | ICD-10-CM | POA: Insufficient documentation

## 2015-11-27 DIAGNOSIS — Z79899 Other long term (current) drug therapy: Secondary | ICD-10-CM | POA: Insufficient documentation

## 2015-11-27 DIAGNOSIS — Y939 Activity, unspecified: Secondary | ICD-10-CM | POA: Diagnosis not present

## 2015-11-27 DIAGNOSIS — F909 Attention-deficit hyperactivity disorder, unspecified type: Secondary | ICD-10-CM | POA: Insufficient documentation

## 2015-11-27 DIAGNOSIS — S161XXA Strain of muscle, fascia and tendon at neck level, initial encounter: Secondary | ICD-10-CM | POA: Diagnosis not present

## 2015-11-27 DIAGNOSIS — S199XXA Unspecified injury of neck, initial encounter: Secondary | ICD-10-CM | POA: Diagnosis present

## 2015-11-27 MED ORDER — CYCLOBENZAPRINE HCL 10 MG PO TABS: 10.0000 mg | ORAL_TABLET | Freq: Three times a day (TID) | ORAL | 0 refills | Status: DC | PRN

## 2015-11-27 MED ORDER — NAPROXEN 500 MG PO TABS: 500.0000 mg | ORAL_TABLET | Freq: Two times a day (BID) | ORAL | 0 refills | Status: DC
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
## 2015-11-27 NOTE — ED Notes (Signed)
Pt verbalized understanding of discharge instructions. NAD at this time. 
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
## 2015-12-27 ENCOUNTER — Ambulatory Visit: Payer: Self-pay | Admitting: Certified Nurse Midwife

## 2016-01-02 ENCOUNTER — Ambulatory Visit (INDEPENDENT_AMBULATORY_CARE_PROVIDER_SITE_OTHER): Payer: Self-pay | Admitting: Certified Nurse Midwife

## 2016-01-02 ENCOUNTER — Encounter: Payer: Self-pay | Admitting: Certified Nurse Midwife

## 2016-01-02 VITALS — BP 119/75 | HR 85 | Ht 68.0 in | Wt 175.1 lb

## 2016-01-02 DIAGNOSIS — Z308 Encounter for other contraceptive management: Secondary | ICD-10-CM

## 2016-01-02 LAB — POCT URINE PREGNANCY: PREG TEST UR: NEGATIVE

## 2016-01-02 NOTE — Patient Instructions (Signed)
Contraception Choices Birth control (contraception) is the use of any methods or devices to stop pregnancy from happening. Below are some methods to help avoid pregnancy. Hormonal birth control  A small tube put under the skin of the upper arm (implant). The tube can stay in place for 3 years. The implant must be taken out after 3 years.  Shots given every 3 months.  Pills taken every day.  Patches that are changed once a week.  A ring put into the vagina (vaginal ring). The ring is left in place for 3 weeks and removed for 1 week. Then, a new ring is put in the vagina.  Emergency birth control pills taken after unprotected sex (intercourse). Barrier birth control  A thin covering worn on the penis (female condom) during sex.  A soft, loose covering put into the vagina (female condom) before sex.  A rubber bowl that sits over the cervix (diaphragm). The bowl must be made for you. The bowl is put into the vagina before sex. The bowl is left in place for 6 to 8 hours after sex.  A small, soft cup that fits over the cervix (cervical cap). The cup must be made for you. The cup can be left in place for 48 hours after sex.  A sponge that is put into the vagina before sex.  A chemical that kills or stops sperm from getting into the cervix and uterus (spermicide). The chemical may be a cream, jelly, foam, or pill. Intrauterine (IUD) birth control  IUD birth control is a small, T-shaped piece of plastic. The plastic is put inside the uterus. There are 2 types of IUD:  Copper IUD. The IUD is covered in copper wire. The copper makes a fluid that kills sperm. It can stay in place for 10 years.  Hormone IUD. The hormone stops pregnancy from happening. It can stay in place for 5 years. Permanent methods  When the woman has her fallopian tubes sealed, tied, or blocked during surgery. This stops the egg from traveling to the uterus.  The doctor places a small coil or insert into each fallopian  tube. This causes scar tissue to form and blocks the fallopian tubes.  When the female has the tubes that carry sperm tied off (vasectomy). Natural family planning birth control  Natural family planning means not having sex or using barrier birth control on the days the woman could become pregnant.  Use a calendar to keep track of the length of each period and know the days she can get pregnant.  Avoid sex during ovulation.  Use a thermometer to measure body temperature. Also watch for symptoms of ovulation.  Time sex to be after the woman has ovulated. Use condoms to help protect yourself against sexually transmitted infections (STIs). Do this no matter what type of birth control you use. Talk to your doctor about which type of birth control is best for you. This information is not intended to replace advice given to you by your health care provider. Make sure you discuss any questions you have with your health care provider. Document Released: 10/19/2008 Document Revised: 05/30/2015 Document Reviewed: 07/13/2012 Elsevier Interactive Patient Education  2017 [employer] Inc.  

## 2016-01-02 NOTE — Progress Notes (Signed)
GYN ENCOUNTER NOTE  Subjective:       Madison Jenkins is a 20 y.o. [id number] female is here for a discussion about possible pregnancy and birth control options.   Pt was original scheduled for a Mirena IUD insertion, but presents today with her significant other questioning possible pregnancy.   Madison Jenkins and her significant other do want more children, but are unsure about the timing of their next pregnancy. Patient would be excited if she is pregnant today.   Madison Jenkins does not desire a Mirena at this time. Pt states, "it does not make since to put something in just to turn back around and take it out".   She would like a "blood test" since her urine pregnancy test was negative today in office.   If she is not pregnant, then she would like to use [facility] for temporary pregnancy prevention. Maybe.     Gynecologic History Patient's last menstrual period was 12/19/2015 (approximate). Contraception: Depo-Provera injections; last injection 08/2015.   Obstetric History OB History  Gravida Para Term Preterm AB Living  2 1 1   1 1   SAB TAB Ectopic Multiple Live Births  1     0 1    # Outcome Date GA Lbr Len/2nd Weight Sex Delivery Anes PTL Lv  2 Term 02/27/15 2120w6d 02:21 / 00:26 7 lb 8.6 oz (3.42 kg) F Vag-Spont EPI, Local  LIV  1 SAB               Past Medical History:  Diagnosis Date  . ADD (attention deficit disorder)    ADHD as per pt  . Asthma   . Eczema   . Murmur   . Orthostatic hypotension     Past Surgical History:  Procedure Laterality Date  . TONSILLECTOMY AND ADENOIDECTOMY      No current outpatient prescriptions on file prior to visit.   No current facility-administered medications on file prior to visit.     Allergies  Allergen Reactions  . Penicillins Anaphylaxis  . Sulfa Antibiotics Anaphylaxis  . Amoxicillin Rash    Social History   Social History  . Marital status: Married    Spouse name: N/A  . Number of children: N/A  . Years of education: N/A    Occupational History  . Not on file.   Social History Main Topics  . Smoking status: Current Every Day Smoker    Packs/day: 0.50    Years: 0.50    Last attempt to quit: 02/22/2014  . Smokeless tobacco: Never Used  . Alcohol use No  . Drug use: No  . Sexual activity: Yes    Birth control/ protection: Injection, Condom   Other Topics Concern  . Not on file   Social History Narrative  . No narrative on file    Family History  Problem Relation Age of Onset  . Breast cancer Paternal Grandmother   . Cancer - Other Mother     ovarian  . Thyroid disease Mother     The following portions of the patient's history were reviewed and updated as appropriate: allergies, current medications, past family history, past medical history, past social history, past surgical history and problem list.  Review of Systems Review of Systems - History obtained from the patient Review of Systems -   General ROS: negative Gastrointestinal ROS: negative Genito-Urinary ROS: negative    Objective:   BP 119/75   Pulse 85   Ht 5\' 8"  (1.727 m)   Wt 175 lb  2 oz (79.4 kg)   LMP 12/19/2015 (Approximate) Comment: SPOTTING MORE THAN FLOW  Breastfeeding? No   BMI 26.63 kg/m    Alert and oriented x 4  Negative UPT   Assessment:   1. Encounter for other contraceptive management  - POCT urine pregnancy - Beta HCG, Quant     Plan:   1. Obtain Beta HCG  2. Education given regarding options for contraception, including oral contraceptive, barrier methods, and the nuvaring.  3. Will follow up with pt via MyChart or phone call with lab results.   If negative, then pt may desire birth control.   If positive, then pt will proceed with lab work and pregnancy care.   RTC as needed.  Gunnar BullaJenkins Michelle Enoc Jenkins, CNM

## 2016-01-03 ENCOUNTER — Other Ambulatory Visit: Payer: Self-pay | Admitting: Certified Nurse Midwife

## 2016-01-03 ENCOUNTER — Encounter: Payer: Self-pay | Admitting: Certified Nurse Midwife

## 2016-01-03 DIAGNOSIS — R635 Abnormal weight gain: Secondary | ICD-10-CM

## 2016-01-03 LAB — BETA HCG QUANT (REF LAB)

## 2016-01-24 ENCOUNTER — Emergency Department
Admission: EM | Admit: 2016-01-24 | Discharge: 2016-01-24 | Disposition: A | Discharge status: Home / Self Care | Payer: Medicaid Other | Attending: Emergency Medicine | Admitting: Emergency Medicine

## 2016-01-24 DIAGNOSIS — F17200 Nicotine dependence, unspecified, uncomplicated: Secondary | ICD-10-CM | POA: Insufficient documentation

## 2016-01-24 DIAGNOSIS — F909 Attention-deficit hyperactivity disorder, unspecified type: Secondary | ICD-10-CM | POA: Insufficient documentation

## 2016-01-24 DIAGNOSIS — J45909 Unspecified asthma, uncomplicated: Secondary | ICD-10-CM | POA: Insufficient documentation

## 2016-01-24 DIAGNOSIS — J09X2 Influenza due to identified novel influenza A virus with other respiratory manifestations: Secondary | ICD-10-CM | POA: Insufficient documentation

## 2016-01-24 DIAGNOSIS — R509 Fever, unspecified: Secondary | ICD-10-CM | POA: Diagnosis present

## 2016-01-24 DIAGNOSIS — J101 Influenza due to other identified influenza virus with other respiratory manifestations: Secondary | ICD-10-CM

## 2016-01-24 LAB — INFLUENZA PANEL BY PCR (TYPE A & B)
INFLAPCR: POSITIVE — AB
INFLBPCR: NEGATIVE

## 2016-01-24 MED ORDER — PROMETHAZINE-DM 6.25-15 MG/5ML PO SYRP: 5.0000 mL | ORAL_SOLUTION | Freq: Four times a day (QID) | ORAL | 0 refills | Status: DC | PRN

## 2016-01-24 MED ORDER — IBUPROFEN 600 MG PO TABS
600.0000 mg | ORAL_TABLET | Freq: Once | ORAL | Status: AC
  Administered 2016-01-24: 600 mg via ORAL
  Filled 2016-01-24: qty 1

## 2016-01-24 MED ORDER — FLUTICASONE PROPIONATE 50 MCG/ACT NA SUSP: 2.0000 | Freq: Every day | NASAL | 0 refills | Status: DC

## 2016-01-24 MED ORDER — OSELTAMIVIR PHOSPHATE 75 MG PO CAPS: 75.0000 mg | ORAL_CAPSULE | Freq: Two times a day (BID) | ORAL | 0 refills | Status: DC

## 2016-01-24 MED ORDER — IBUPROFEN 100 MG/5ML PO SUSP: 600.0000 mg | Freq: Once | ORAL | Status: DC

## 2016-01-24 NOTE — Discharge Instructions (Signed)
Rest. Push fluids. Avoid beverages high in caffeine or sugar. Alternate Tylenol and ibuprofen as needed for fever or aches.

## 2016-01-24 NOTE — ED Provider Notes (Signed)
[facility] Emergency Department Provider Note  ____________________________________________  Time seen: Approximately 5:07 PM  I have reviewed the triage vital signs and the nursing notes.   HISTORY  Chief Complaint Influenza    HPI SAE HANDRICH is a 21 y.o. female, NAD, presents to the emergency department with sudden onset fever, chills, body aches, cough and chest congestion. States that her husband has similar symptoms which also began yesterday. Has had nasal congestion, runny nose but no sinus congestion. Mild left ear pain but no drainage. Has taken over-the-counter medications without significant relief of symptoms. Denies chest pain, shortness breath, wheezing, abdominal pain, nausea, vomiting or diarrhea.   Past Medical History:  Diagnosis Date  . ADD (attention deficit disorder)    ADHD as per pt  . Asthma   . Eczema   . Murmur   . Orthostatic hypotension     Patient Active Problem List   Diagnosis Date Noted  . Back pain 02/23/2015    Past Surgical History:  Procedure Laterality Date  . TONSILLECTOMY AND ADENOIDECTOMY      Prior to Admission medications   Medication Sig Start Date End Date Taking? Authorizing Provider  ciprofloxacin (CIPRO) 250 MG tablet Take 250 mg by mouth 2 (two) times daily.    Historical Provider, MD  fluticasone (FLONASE) 50 MCG/ACT nasal spray Place 2 sprays into both nostrils daily. 01/24/16   Hans Rusher L Dayja Loveridge, PA-C  oseltamivir (TAMIFLU) 75 MG capsule Take 1 capsule (75 mg total) by mouth 2 (two) times daily. 01/24/16   Ruari Duggan L Kason Benak, PA-C  promethazine-dextromethorphan (PROMETHAZINE-DM) 6.25-15 MG/5ML syrup Take 5 mLs by mouth 4 (four) times daily as needed for cough. 01/24/16   Jae Skeet L Ashlen Kiger, PA-C    Allergies Penicillins; Sulfa antibiotics; and Amoxicillin  Family History  Problem Relation Age of Onset  . Breast cancer Paternal Grandmother   . Cancer - Other Mother     ovarian  . Thyroid disease Mother      Social History Social History  Substance Use Topics  . Smoking status: Current Every Day Smoker    Packs/day: 0.50    Years: 0.50    Last attempt to quit: 02/22/2014  . Smokeless tobacco: Never Used  . Alcohol use No     Review of Systems  Constitutional: Positive fever, chills. No fatigue and no decreased appetite Eyes: No visual changes. No discharge, redness ENT: Positive nasal congestion, runny nose, left ear pain and pressure. No sore throat. Cardiovascular: No chest pain. Respiratory: Positive cough, chest congestion. No shortness of breath. No wheezing.  Gastrointestinal: No abdominal pain.  No nausea, vomiting.  No diarrhea. Musculoskeletal: Positive for general myalgias.  Skin: Negative for rash. Neurological: Negative for headaches, focal weakness or numbness. 10-point ROS otherwise negative.  ____________________________________________   PHYSICAL EXAM:  VITAL SIGNS: ED Triage Vitals  Enc Vitals Group     BP 01/24/16 1623 102/73     Pulse Rate 01/24/16 1623 (!) 140     Resp 01/24/16 1623 17     Temp 01/24/16 1623 (!) 103.2 F (39.6 C)     Temp Source 01/24/16 1623 Oral     SpO2 01/24/16 1623 99 %     Weight 01/24/16 1624 16 lb (7.258 kg)     Height 01/24/16 1624 5\' 8"  (1.727 m)     Head Circumference --      Peak Flow --      Pain Score 01/24/16 1624 5     Pain Loc --  Pain Edu? --      Excl. in GC? --      Constitutional: Alert and oriented. Well appearing and in no acute distress. Eyes: Conjunctivae are normal.  Head: Atraumatic. ENT:      Ears: TMs visualized bilaterally without erythema, effusion, bulging or perforation      Nose: No congestion with clear rhinorrhea. Bilateral turbinates are injected.      Mouth/Throat: Mucous membranes are moist.  Erythema, swelling of the patient. Uvula is midline. Airway is patent. Clear postnasal drainage. Neck: Upper with forward motion. Hematological/Lymphatic/Immunilogical: No cervical  lymphadenopathy. Cardiovascular: Tachycardic rate, regular rhythm. Normal S1 and S2.  No murmurs, rubs, gallops. Good peripheral circulation. Respiratory: Normal respiratory effort without tachypnea or retractions. Lungs CTAB with breath sounds noted in all lung fields. No wheeze, rhonchi, rales. Neurologic:  Normal speech and language. No gross focal neurologic deficits are appreciated.  Skin:  Skin is warm, dry and intact. No rash noted. Skin turgor is normal. Psychiatric: Mood and affect are normal. Speech and behavior are normal. Patient exhibits appropriate insight and judgement.   ____________________________________________   LABS (all labs ordered are listed, but only abnormal results are displayed)  Labs Reviewed  INFLUENZA PANEL BY PCR (TYPE A & B) - Abnormal; Notable for the following:       Result Value   Influenza A By PCR POSITIVE (*)    All other components within normal limits   ____________________________________________  EKG  None ____________________________________________  RADIOLOGY  None ____________________________________________    PROCEDURES  Procedure(s) performed: None   Procedures   Medications  ibuprofen (ADVIL,MOTRIN) tablet 600 mg (600 mg Oral Given 01/24/16 1642)     ____________________________________________   INITIAL IMPRESSION / ASSESSMENT AND PLAN / ED COURSE  Pertinent labs & imaging results that were available during my care of the patient were reviewed by me and considered in my medical decision making (see chart for details).     Patient's diagnosis is consistent with Influenza A. Patient was given ibuprofen while in the emergency department and noted significant decrease in temperature and heart rate. Patient notes significant improvement of her symptoms with just the administration of ibuprofen. Patient will be discharged home with prescriptions for Tamiflu, Flonase, promethazine DM to take as directed. Patient is  advised to alternate Tylenol and ibuprofen every 4-6 hours as needed for fever or aches. Patient also advised to intake water, Gatorade or other beverages that are low in sugar and no caffeine. Patient also advised to avoid any alcohol at this stage. [facility] or her primary care provider if symptoms persist past this treatment course. Patient is given ED precautions to return to the ED for any worsening or new symptoms.   ____________________________________________  FINAL CLINICAL IMPRESSION(S) / ED DIAGNOSES  Final diagnoses:  Influenza A      NEW MEDICATIONS STARTED DURING THIS VISIT:  New Prescriptions   FLUTICASONE (FLONASE) 50 MCG/ACT NASAL SPRAY    Place 2 sprays into both nostrils daily.   OSELTAMIVIR (TAMIFLU) 75 MG CAPSULE    Take 1 capsule (75 mg total) by mouth 2 (two) times daily.   PROMETHAZINE-DEXTROMETHORPHAN (PROMETHAZINE-DM) 6.25-15 MG/5ML SYRUP    Take 5 mLs by mouth 4 (four) times daily as needed for cough.         Hope PigeonJami L Tayon Parekh, PA-C 01/24/16 1811    Governor Rooksebecca Lord, MD 01/24/16 2043

## 2016-01-24 NOTE — ED Notes (Signed)
The patient was given a cool damp cloth for her forehead.

## 2016-01-24 NOTE — ED Triage Notes (Signed)
Pt c/o f;u symptoms for the last 2 days - c/o cough/congestion, nasal drainage/congestion and left ear pain

## 2016-02-26 ENCOUNTER — Encounter: Payer: Self-pay | Admitting: Certified Nurse Midwife

## 2016-02-28 ENCOUNTER — Other Ambulatory Visit: Payer: Self-pay

## 2016-02-28 DIAGNOSIS — R635 Abnormal weight gain: Secondary | ICD-10-CM

## 2016-02-29 LAB — TSH: TSH: 6.79 u[IU]/mL — ABNORMAL HIGH (ref 0.450–4.500)

## 2016-03-02 ENCOUNTER — Telehealth: Payer: Self-pay | Admitting: Obstetrics and Gynecology

## 2016-03-02 NOTE — Telephone Encounter (Signed)
PT CALLING WANTING TO KNOW THE RESULTS OF HER TEST.

## 2016-03-03 NOTE — Telephone Encounter (Signed)
See my chart message

## 2016-03-05 ENCOUNTER — Encounter: Payer: Self-pay | Admitting: Certified Nurse Midwife

## 2016-03-06 ENCOUNTER — Other Ambulatory Visit: Payer: Self-pay

## 2016-03-08 ENCOUNTER — Emergency Department (HOSPITAL_COMMUNITY)
Admission: EM | Admit: 2016-03-08 | Discharge: 2016-03-08 | Disposition: A | Discharge status: Home / Self Care | Payer: Medicaid Other | Attending: Emergency Medicine | Admitting: Emergency Medicine

## 2016-03-08 ENCOUNTER — Encounter (HOSPITAL_COMMUNITY): Payer: Self-pay

## 2016-03-08 DIAGNOSIS — F17200 Nicotine dependence, unspecified, uncomplicated: Secondary | ICD-10-CM | POA: Diagnosis not present

## 2016-03-08 DIAGNOSIS — F909 Attention-deficit hyperactivity disorder, unspecified type: Secondary | ICD-10-CM | POA: Diagnosis not present

## 2016-03-08 DIAGNOSIS — R21 Rash and other nonspecific skin eruption: Secondary | ICD-10-CM | POA: Diagnosis not present

## 2016-03-08 DIAGNOSIS — J45909 Unspecified asthma, uncomplicated: Secondary | ICD-10-CM | POA: Diagnosis not present

## 2016-03-08 LAB — CBC WITH DIFFERENTIAL/PLATELET
BASOS ABS: 0 10*3/uL (ref 0.0–0.1)
BASOS PCT: 0 %
EOS PCT: 4 %
Eosinophils Absolute: 0.3 10*3/uL (ref 0.0–0.7)
HEMATOCRIT: 40 % (ref 36.0–46.0)
Hemoglobin: 13.5 g/dL (ref 12.0–15.0)
LYMPHS PCT: 34 %
Lymphs Abs: 2.5 10*3/uL (ref 0.7–4.0)
MCH: 28.4 pg (ref 26.0–34.0)
MCHC: 33.8 g/dL (ref 30.0–36.0)
MCV: 84 fL (ref 78.0–100.0)
MONO ABS: 0.5 10*3/uL (ref 0.1–1.0)
Monocytes Relative: 6 %
NEUTROS ABS: 4.1 10*3/uL (ref 1.7–7.7)
Neutrophils Relative %: 56 %
PLATELETS: 259 10*3/uL (ref 150–400)
RBC: 4.76 MIL/uL (ref 3.87–5.11)
RDW: 14 % (ref 11.5–15.5)
WBC: 7.4 10*3/uL (ref 4.0–10.5)

## 2016-03-08 LAB — SEDIMENTATION RATE: Sed Rate: 6 mm/hr (ref 0–22)

## 2016-03-08 NOTE — Discharge Instructions (Signed)
We are unsure of the cause of your rash. Take Benadryl if you have itching. Follow up with your doctor or with Dr. Margo AyeHall, dermatology.

## 2016-03-08 NOTE — ED Triage Notes (Signed)
Patient complains of fine rash to left lower arm x 1 day, denies itching, no known exposures, states pain at wrist, NAD

## 2016-03-08 NOTE — ED Notes (Signed)
Patient left at this time with all belongings. 

## 2016-03-08 NOTE — ED Provider Notes (Signed)
[facility]-EMERGENCY DEPT Provider Note   CSN: [id number] Arrival date & time: 03/08/16  1738   By signing my name below, I, Soijett Blue, attest that this documentation has been prepared under the direction and in the presence of Kerrie BuffaloHope Edel Rivero, NP Electronically Signed: Soijett Blue, ED Scribe. 03/08/16. 6:51 PM.  History   Chief Complaint Chief Complaint  Patient presents with  . Rash    HPI  Madison Jenkins is a 21 y.o. female with a PMHx of eczema, who presents to the Emergency Department complaining of non-pruritic rash to left forearm onset earlier today. Pt reports associated redness to affected area and tingling to left fingers. Pt has not tried any medications for the relief of her symptoms. She notes that she was cleaning her daughters room without cleaning products prior to the onset of her symptoms. Pt states that there is no burning sensation to the affected area. Pt denies new soaps, medications, pets, environment, lotion, or detergent. She denies wound, fever, chills, recent arm injury, and any other symptoms. Denies daily medication use.  The history is provided by the patient. No language interpreter was used.    Past Medical History:  Diagnosis Date  . ADD (attention deficit disorder)    ADHD as per pt  . Asthma   . Eczema   . Murmur   . Orthostatic hypotension     Patient Active Problem List   Diagnosis Date Noted  . Back pain 02/23/2015    Past Surgical History:  Procedure Laterality Date  . TONSILLECTOMY AND ADENOIDECTOMY      OB History    Gravida Para Term Preterm AB Living   2 1 1   1 1    SAB TAB Ectopic Multiple Live Births   1     0 1       Home Medications    Prior to Admission medications   Medication Sig Start Date End Date Taking? Authorizing Provider  ciprofloxacin (CIPRO) 250 MG tablet Take 250 mg by mouth 2 (two) times daily.    Historical Provider, MD  fluticasone (FLONASE) 50 MCG/ACT nasal spray Place 2 sprays into both nostrils daily.  01/24/16   Jami L Hagler, PA-C  oseltamivir (TAMIFLU) 75 MG capsule Take 1 capsule (75 mg total) by mouth 2 (two) times daily. 01/24/16   Jami L Hagler, PA-C  promethazine-dextromethorphan (PROMETHAZINE-DM) 6.25-15 MG/5ML syrup Take 5 mLs by mouth 4 (four) times daily as needed for cough. 01/24/16   Jami L Hagler, PA-C    Family History Family History  Problem Relation Age of Onset  . Breast cancer Paternal Grandmother   . Cancer - Other Mother     ovarian  . Thyroid disease Mother     Social History Social History  Substance Use Topics  . Smoking status: Current Every Day Smoker    Packs/day: 0.50    Years: 0.50    Last attempt to quit: 02/22/2014  . Smokeless tobacco: Never Used  . Alcohol use No     Allergies   Penicillins; Sulfa antibiotics; and Amoxicillin   Review of Systems Review of Systems  Constitutional: Negative for chills and fever.  Skin: Positive for color change (redness to affected areas) and rash (left forearm). Negative for wound.  Neurological:       +tingling to left fingers  All other systems reviewed and are negative.    Physical Exam Updated Vital Signs BP 118/62 (BP Location: Right Arm)   Pulse 80   Temp 98.2 F (  36.8 C) (Oral)   Resp 16   SpO2 99%   Physical Exam  Constitutional: She is oriented to person, place, and time. She appears well-developed and well-nourished. No distress.  HENT:  Head: Normocephalic and atraumatic.  Eyes: EOM are normal.  Neck: Neck supple.  Cardiovascular: Normal rate.   Pulses:      Radial pulses are 2+ on the left side.  Radial pulse 2 + with adequate circulation.   Pulmonary/Chest: Effort normal. No respiratory distress.  Musculoskeletal: Normal range of motion.  See skin exam  Neurological: She is alert and oriented to person, place, and time.  Skin: Skin is warm and dry. There is erythema.  Left arm with redness noted that starts at the left wrist on the dorsum of the arm and extends to the elbow.  Redness on palmar aspect that extends slightly above left elbow. Rash is petechial.   Psychiatric: She has a normal mood and affect. Her behavior is normal.  Nursing note and vitals reviewed.      ED Treatments / Results  DIAGNOSTIC STUDIES: Oxygen Saturation is 99% on RA, nl by my interpretation.    COORDINATION OF CARE: 6:49 PM Discussed treatment plan with pt at bedside which includes consult with attending, labs, and pt agreed to plan.  Dr. Fredderick Phenix in to examine the patient and will order CBC and Sed rate.   Labs (all labs ordered are listed, but only abnormal results are displayed) Labs Reviewed  CBC WITH DIFFERENTIAL/PLATELET  SEDIMENTATION RATE    Procedures Procedures (including critical care time)  Medications Ordered in ED Medications - No data to display   Initial Impression / Assessment and Plan / ED Course  I have reviewed the triage vital signs and the nursing notes.  Pertinent labs that were available during my care of the patient were reviewed by me and considered in my medical decision making (see chart for details).   Patient with nonspecific eruption. No signs of infection. Consider ITP but unlikely given normal platelets. Discharge with symptomatic treatment and advised use of benadryl for itching PRN. Follow up with PCP or dermatologist  in 2-3 days or sooner. Return here as needed.   Final Clinical Impressions(s) / ED Diagnoses   Final diagnoses:  Rash    New Prescriptions Discharge Medication List as of 03/08/2016  9:34 PM     I personally performed the services described in this documentation, which was scribed in my presence. The recorded information has been reviewed and is accurate.     [place], NP 03/08/16 2316    Maia Plan, MD 03/08/16 [id number]

## 2016-03-15 LAB — SPECIMEN STATUS REPORT

## 2016-03-15 LAB — THYROID PANEL
FREE THYROXINE INDEX: 1.4 (ref 1.2–4.9)
T3 UPTAKE RATIO: 24 % (ref 24–39)
T4 TOTAL: 5.8 ug/dL (ref 4.5–12.0)

## 2016-03-31 ENCOUNTER — Emergency Department: Payer: Medicaid Other

## 2016-03-31 ENCOUNTER — Emergency Department
Admission: EM | Admit: 2016-03-31 | Discharge: 2016-03-31 | Disposition: A | Discharge status: Home / Self Care | Payer: Medicaid Other | Attending: Emergency Medicine | Admitting: Emergency Medicine

## 2016-03-31 DIAGNOSIS — Y939 Activity, unspecified: Secondary | ICD-10-CM | POA: Diagnosis not present

## 2016-03-31 DIAGNOSIS — Y999 Unspecified external cause status: Secondary | ICD-10-CM | POA: Insufficient documentation

## 2016-03-31 DIAGNOSIS — M542 Cervicalgia: Secondary | ICD-10-CM | POA: Insufficient documentation

## 2016-03-31 DIAGNOSIS — R531 Weakness: Secondary | ICD-10-CM | POA: Insufficient documentation

## 2016-03-31 DIAGNOSIS — Y92410 Unspecified street and highway as the place of occurrence of the external cause: Secondary | ICD-10-CM | POA: Insufficient documentation

## 2016-03-31 DIAGNOSIS — F909 Attention-deficit hyperactivity disorder, unspecified type: Secondary | ICD-10-CM | POA: Insufficient documentation

## 2016-03-31 DIAGNOSIS — F17200 Nicotine dependence, unspecified, uncomplicated: Secondary | ICD-10-CM | POA: Insufficient documentation

## 2016-03-31 DIAGNOSIS — J45909 Unspecified asthma, uncomplicated: Secondary | ICD-10-CM | POA: Insufficient documentation

## 2016-03-31 DIAGNOSIS — Z79899 Other long term (current) drug therapy: Secondary | ICD-10-CM | POA: Diagnosis not present

## 2016-03-31 DIAGNOSIS — S199XXA Unspecified injury of neck, initial encounter: Secondary | ICD-10-CM | POA: Diagnosis present

## 2016-03-31 LAB — POCT PREGNANCY, URINE: PREG TEST UR: NEGATIVE

## 2016-03-31 MED ORDER — MELOXICAM 7.5 MG PO TABS: 7.5000 mg | ORAL_TABLET | Freq: Every day | ORAL | 1 refills | Status: AC

## 2016-03-31 MED ORDER — CYCLOBENZAPRINE HCL 5 MG PO TABS: 5.0000 mg | ORAL_TABLET | Freq: Three times a day (TID) | ORAL | 0 refills | Status: AC | PRN
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
## 2016-05-15 ENCOUNTER — Encounter: Payer: Self-pay | Admitting: Certified Nurse Midwife

## 2016-05-15 ENCOUNTER — Ambulatory Visit (INDEPENDENT_AMBULATORY_CARE_PROVIDER_SITE_OTHER): Payer: Medicaid Other | Admitting: Certified Nurse Midwife

## 2016-05-15 VITALS — BP 114/75 | HR 86 | Ht 69.0 in | Wt 195.9 lb

## 2016-05-15 DIAGNOSIS — R635 Abnormal weight gain: Secondary | ICD-10-CM

## 2016-05-15 DIAGNOSIS — R946 Abnormal results of thyroid function studies: Secondary | ICD-10-CM | POA: Diagnosis not present

## 2016-05-15 DIAGNOSIS — R7989 Other specified abnormal findings of blood chemistry: Secondary | ICD-10-CM

## 2016-05-15 DIAGNOSIS — N926 Irregular menstruation, unspecified: Secondary | ICD-10-CM

## 2016-05-15 LAB — POCT URINE PREGNANCY: PREG TEST UR: NEGATIVE

## 2016-05-15 NOTE — Progress Notes (Signed)
GYN ENCOUNTER NOTE  Subjective:       Madison Jenkins is a 21 y.o. G28P1011 female here for gynecologic evaluation of irregular menses, weight gain, and elevated TSH level.   She reports a 20 pound weight gain since her last office visit on 01/02/2016. A thyroid panel was collected on 02/28/2016 due to irregular menses. TSH elevated, pt advised to follow up for repeat thyroid panel in six (6) weeks. Pt did not follow up as previously advised.   She presents today with irregular menses. Her most recent period started 05/05/2016 and consisted of spotting that lasted for four (4) days.   Her last period was 04/03/2016 and lasted less that seven (7) days with "normal" bleeding, but "bad cramping".   Other symptoms associated with her irregular bleeding are increased moodiness, headaches without visual disturbances, and nausea.   She denies intramenstrual bleeding/spotting and pain with intercourse.   Denies difficulty breathing or respiratory distress, chest pain, abdominal pain, and leg pain or swelling.   She was seen by her PCP today and started on an antidepressant and prescription headache medications, but she does not know the name of either medication.    Gynecologic History  Patient's last menstrual period was 05/05/2016 (exact date).  Contraception: none  Last Pap: N/A.   Obstetric History  OB History  Gravida Para Term Preterm AB Living  2 1 1   1 1   SAB TAB Ectopic Multiple Live Births  1     0 1    # Outcome Date GA Lbr Len/2nd Weight Sex Delivery Anes PTL Lv  2 Term 02/27/15 [redacted]w[redacted]d 02:21 / 00:26 7 lb 8.6 oz (3.42 kg) F Vag-Spont EPI, Local  LIV  1 SAB               Past Medical History:  Diagnosis Date  . ADD (attention deficit disorder)    ADHD as per pt  . Asthma   . Depression   . Eczema   . Headache   . Murmur   . Orthostatic hypotension     Past Surgical History:  Procedure Laterality Date  . TONSILLECTOMY AND ADENOIDECTOMY       Allergies   Allergen Reactions  . Penicillins Anaphylaxis  . Sulfa Antibiotics Anaphylaxis  . Amoxicillin Rash    Social History   Social History  . Marital status: Married    Spouse name: N/A  . Number of children: N/A  . Years of education: N/A   Occupational History  . Not on file.   Social History Main Topics  . Smoking status: Former Smoker    Packs/day: 0.50    Years: 0.50    Quit date: 05/06/2015  . Smokeless tobacco: Never Used  . Alcohol use No  . Drug use: No  . Sexual activity: Yes    Birth control/ protection: None   Other Topics Concern  . Not on file   Social History Narrative  . No narrative on file    Family History  Problem Relation Age of Onset  . Breast cancer Paternal Grandmother   . Cancer - Other Mother        ovarian  . Thyroid disease Mother     The following portions of the patient's history were reviewed and updated as appropriate: allergies, current medications, past family history, past medical history, past social history, past surgical history and problem list.  Review of Systems  Review of Systems - Negative except as noted above.  History obtained  from the patient.   Objective:   BP 114/75   Pulse 86   Ht 5\' 9"  (1.753 m)   Wt 195 lb 14.4 oz (88.9 kg)   LMP 05/05/2016 (Exact Date)   Breastfeeding? No   BMI 28.93 kg/m   CONSTITUTIONAL: Well-developed, well-nourished female in no acute distress.   ABDOMEN: Soft, non distended; Non tender.  No Organomegaly.  PELVIC:  External Genitalia: Normal  Vagina: Normal  Cervix: Normal  Uterus: Normal size, shape,consistency, mobile  Adnexa: Normal  Negative UPT  Assessment:   1. Irregular menses - POCT urine pregnancy - Beta HCG, Quant  2. Elevated TSH - Thyroid Panel With TSH   Plan:   Advised elevated TSH likely reason for irregular menses.   Encouraged pt to contact pharmacy for names and information on new medications prescribed by PCP.   Labs: Thyroid panel and Beta  HCG, see orders. Will contact pt via MyChart with results and refer to Endocrinology as needed.   Reviewed red flag symptoms and when to call.   RTC as needed.    Gunnar BullaJenkins Madison Jenkins, CNM

## 2016-05-15 NOTE — Patient Instructions (Signed)
Thyroid-Stimulating Hormone Test °Why am I having this test? °A thyroid-stimulating hormone (TSH) test is a blood test that is done to measure the level of TSH, also known as thyrotropin, in your blood. TSH is produced by the pituitary gland. The pituitary gland is a small organ located just below the brain, behind your eyes and nasal passages. It is part of a system that monitors and maintains thyroid hormone levels and thyroid gland function. Thyroid hormones affect many body parts and systems, including the system that affects how quickly your body burns fuel for energy. °Your health care provider may recommend testing your TSH level if you have signs and symptoms of abnormal thyroid hormone levels. Knowing the level of TSH in your blood can help your health care provider: °· Diagnose a thyroid gland or pituitary gland disorder. °· Manage your condition and treatment if you have hypothyroidism or hyperthyroidism. °What kind of sample is taken? °A blood sample is required for this test. It is usually collected by inserting a needle into a vein. °How do I prepare for this test? °There is no preparation required for this test. °What are the reference ranges? °Reference ranges are considered healthy ranges established after testing a large group of healthy people. Reference ranges may vary among different people, labs, and hospitals. It is your responsibility to obtain your test results. Ask the lab or department performing the test when and how you will get your results. °Range of Normal Values: °· Adult: 0.3-5 microunits/mL or 0.3-5 milliunits/L (SI units). °· Newborn: 3-18 microunits/mL or 3-18 milliunits/L. °· Cord: 3-12 microunits/mL or 3-12 milliunits/L. °What do the results mean? °A high level of TSH may mean: °· Your thyroid gland is not making enough thyroid hormones. When the thyroid gland does not make enough thyroid hormones, the pituitary gland releases TSH into the bloodstream. The higher-than-normal  levels of TSH prompt the thyroid gland to release more thyroid hormones. °· You are getting an insufficient level of thyroid hormone medicine, if you are receiving this type of treatment. °· There is a problem with the pituitary gland (rare). °A low level of TSH can indicate a problem with the pituitary gland. °Talk with your health care provider to discuss your results, treatment options, and if necessary, the need for more tests. Talk with your health care provider if you have any questions about your results. °Talk with your health care provider to discuss your results, treatment options, and if necessary, the need for more tests. Talk with your health care provider if you have any questions about your results. °This information is not intended to replace advice given to you by your health care provider. Make sure you discuss any questions you have with your health care provider. °Document Released: 01/17/2004 Document Revised: 08/25/2015 Document Reviewed: 05/17/2013 °Elsevier Interactive Patient Education © 2017 [employer] ° °

## 2016-05-16 LAB — THYROID PANEL WITH TSH
Free Thyroxine Index: 1.3 (ref 1.2–4.9)
T3 UPTAKE RATIO: 24 % (ref 24–39)
T4 TOTAL: 5.6 ug/dL (ref 4.5–12.0)
TSH: 2.45 u[IU]/mL (ref 0.450–4.500)

## 2016-05-16 LAB — BETA HCG QUANT (REF LAB)

## 2016-05-18 ENCOUNTER — Encounter: Payer: Self-pay | Admitting: Certified Nurse Midwife

## 2016-05-19 ENCOUNTER — Other Ambulatory Visit: Payer: Self-pay | Admitting: Certified Nurse Midwife

## 2016-05-19 DIAGNOSIS — R7989 Other specified abnormal findings of blood chemistry: Secondary | ICD-10-CM

## 2016-05-19 DIAGNOSIS — R635 Abnormal weight gain: Secondary | ICD-10-CM

## 2016-06-02 ENCOUNTER — Encounter: Payer: Self-pay | Admitting: Certified Nurse Midwife
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
## 2016-08-18 ENCOUNTER — Encounter: Payer: Medicaid Other | Admitting: Certified Nurse Midwife

## 2016-08-21 ENCOUNTER — Ambulatory Visit (INDEPENDENT_AMBULATORY_CARE_PROVIDER_SITE_OTHER): Payer: Medicaid Other | Admitting: Certified Nurse Midwife

## 2016-08-21 ENCOUNTER — Encounter: Payer: Self-pay | Admitting: Certified Nurse Midwife

## 2016-08-21 VITALS — BP 120/76 | HR 86 | Ht 69.0 in | Wt 190.6 lb

## 2016-08-21 DIAGNOSIS — Z3009 Encounter for other general counseling and advice on contraception: Secondary | ICD-10-CM

## 2016-08-21 NOTE — Progress Notes (Signed)
Pt would like to discuss birth control options   

## 2016-08-21 NOTE — Patient Instructions (Signed)
Ethinyl Estradiol; Etonogestrel vaginal ring What is this medicine? ETHINYL ESTRADIOL; ETONOGESTREL (ETH in il es tra DYE ole; et oh noe JES trel) vaginal ring is a flexible, vaginal ring used as a contraceptive (birth control method). This medicine combines two types of female hormones, an estrogen and a progestin. This ring is used to prevent ovulation and pregnancy. Each ring is effective for one month. This medicine may be used for other purposes; ask your health care provider or pharmacist if you have questions. COMMON BRAND NAME(S): NuvaRing What should I tell my health care provider before I take this medicine? They need to know if you have or ever had any of these conditions: -abnormal vaginal bleeding -blood vessel disease or blood clots -breast, cervical, endometrial, ovarian, liver, or uterine cancer -diabetes -gallbladder disease -heart disease or recent heart attack -high blood pressure -high cholesterol -kidney disease -liver disease -migraine headaches -stroke -systemic lupus erythematosus (SLE) -tobacco smoker -an unusual or allergic reaction to estrogens, progestins, other medicines, foods, dyes, or preservatives -pregnant or trying to get pregnant -breast-feeding How should I use this medicine? Insert the ring into your vagina as directed. Follow the directions on the prescription label. The ring will remain place for 3 weeks and is then removed for a 1-week break. A new ring is inserted 1 week after the last ring was removed, on the same day of the week. Check often to make sure the ring is still in place, especially before and after sexual intercourse. If the ring was out of the vagina for an unknown amount of time, you may not be protected from pregnancy. Perform a pregnancy test and call your doctor. Do not use more often than directed. A patient package insert for the product will be given with each prescription and refill. Read this sheet carefully each time. The  sheet may change frequently. Contact your pediatrician regarding the use of this medicine in children. Special care may be needed. This medicine has been used in female children who have started having menstrual periods. Overdosage: If you think you have taken too much of this medicine contact a poison control center or emergency room at once. NOTE: This medicine is only for you. Do not share this medicine with others. What if I miss a dose? You will need to replace your vaginal ring once a month as directed. If the ring should slip out, or if you leave it in longer or shorter than you should, contact your health care professional for advice. What may interact with this medicine? Do not take this medicine with the following medication: -dasabuvir; ombitasvir; paritaprevir; ritonavir -ombitasvir; paritaprevir; ritonavir This medicine may also interact with the following medications: -acetaminophen -antibiotics or medicines for infections, especially rifampin, rifabutin, rifapentine, and griseofulvin, and possibly penicillins or tetracyclines -aprepitant -ascorbic acid (vitamin C) -atorvastatin -barbiturate medicines, such as phenobarbital -bosentan -carbamazepine -caffeine -clofibrate -cyclosporine -dantrolene -doxercalciferol -felbamate -grapefruit juice -hydrocortisone -medicines for anxiety or sleeping problems, such as diazepam or temazepam -medicines for diabetes, including pioglitazone -modafinil -mycophenolate -nefazodone -oxcarbazepine -phenytoin -prednisolone -ritonavir or other medicines for HIV infection or AIDS -rosuvastatin -selegiline -soy isoflavones supplements -St. John's wort -tamoxifen or raloxifene -theophylline -thyroid hormones -topiramate -warfarin This list may not describe all possible interactions. Give your health care provider a list of all the medicines, herbs, non-prescription drugs, or dietary supplements you use. Also tell them if you smoke,  drink alcohol, or use illegal drugs. Some items may interact with your medicine. What should I watch for while using   this medicine? Visit your doctor or health care professional for regular checks on your progress. You will need a regular breast and pelvic exam and Pap smear while on this medicine. Use an additional method of contraception during the first cycle that you use this ring. Do not use a diaphragm or female condom, as the ring can interfere with these birth control methods and their proper placement. If you have any reason to think you are pregnant, stop using this medicine right away and contact your doctor or health care professional. If you are using this medicine for hormone related problems, it may take several cycles of use to see improvement in your condition. Smoking increases the risk of getting a blood clot or having a stroke while you are using hormonal birth control, especially if you are more than 21 years old. You are strongly advised not to smoke. This medicine can make your body retain fluid, making your fingers, hands, or ankles swell. Your blood pressure can go up. Contact your doctor or health care professional if you feel you are retaining fluid. This medicine can make you more sensitive to the sun. Keep out of the sun. If you cannot avoid being in the sun, wear protective clothing and use sunscreen. Do not use sun lamps or tanning beds/booths. If you wear contact lenses and notice visual changes, or if the lenses begin to feel uncomfortable, consult your eye care specialist. In some women, tenderness, swelling, or minor bleeding of the gums may occur. Notify your dentist if this happens. Brushing and flossing your teeth regularly may help limit this. See your dentist regularly and inform your dentist of the medicines you are taking. If you are going to have elective surgery, you may need to stop using this medicine before the surgery. Consult your health care professional  for advice. This medicine does not protect you against HIV infection (AIDS) or any other sexually transmitted diseases. What side effects may I notice from receiving this medicine? Side effects that you should report to your doctor or health care professional as soon as possible: -breast tissue changes or discharge -changes in vaginal bleeding during your period or between your periods -chest pain -coughing up blood -dizziness or fainting spells -headaches or migraines -leg, arm or groin pain -severe or sudden headaches -stomach pain (severe) -sudden shortness of breath -sudden loss of coordination, especially on one side of the body -speech problems -symptoms of vaginal infection like itching, irritation or unusual discharge -tenderness in the upper abdomen -vomiting -weakness or numbness in the arms or legs, especially on one side of the body -yellowing of the eyes or skin Side effects that usually do not require medical attention (report to your doctor or health care professional if they continue or are bothersome): -breakthrough bleeding and spotting that continues beyond the 3 initial cycles of pills -breast tenderness -mood changes, anxiety, depression, frustration, anger, or emotional outbursts -increased sensitivity to sun or ultraviolet light -nausea -skin rash, acne, or brown spots on the skin -weight gain (slight) This list may not describe all possible side effects. Call your doctor for medical advice about side effects. You may report side effects to FDA at 1-800-FDA-1088. Where should I keep my medicine? Keep out of the reach of children. Store at room temperature between 15 and 30 degrees C (59 and 86 degrees F) for up to 4 months. The product will expire after 4 months. Protect from light. Throw away any unused medicine after the expiration date. NOTE: This   sheet is a summary. It may not cover all possible information. If you have questions about this medicine, talk  to your doctor, pharmacist, or health care provider.  2018 [employer] Standard (2015-08-30 17:00:31) Etonogestrel implant What is this medicine? ETONOGESTREL (et oh noe JES trel) is a contraceptive (birth control) device. It is used to prevent pregnancy. It can be used for up to 3 years. This medicine may be used for other purposes; ask your health care provider or pharmacist if you have questions. COMMON BRAND NAME(S): Implanon, Nexplanon What should I tell my health care provider before I take this medicine? They need to know if you have any of these conditions: -abnormal vaginal bleeding -blood vessel disease or blood clots -cancer of the breast, cervix, or liver -depression -diabetes -gallbladder disease -headaches -heart disease or recent heart attack -high blood pressure -high cholesterol -kidney disease -liver disease -renal disease -seizures -tobacco smoker -an unusual or allergic reaction to etonogestrel, other hormones, anesthetics or antiseptics, medicines, foods, dyes, or preservatives -pregnant or trying to get pregnant -breast-feeding How should I use this medicine? This device is inserted just under the skin on the inner side of your upper arm by a health care professional. Talk to your pediatrician regarding the use of this medicine in children. Special care may be needed. Overdosage: If you think you have taken too much of this medicine contact a poison control center or emergency room at once. NOTE: This medicine is only for you. Do not share this medicine with others. What if I miss a dose? This does not apply. What may interact with this medicine? Do not take this medicine with any of the following medications: -amprenavir -bosentan -fosamprenavir This medicine may also interact with the following medications: -barbiturate medicines for inducing sleep or treating seizures -certain medicines for fungal infections like ketoconazole and  itraconazole -grapefruit juice -griseofulvin -medicines to treat seizures like carbamazepine, felbamate, oxcarbazepine, phenytoin, topiramate -modafinil -phenylbutazone -rifampin -rufinamide -some medicines to treat HIV infection like atazanavir, indinavir, lopinavir, nelfinavir, tipranavir, ritonavir -St. John's wort This list may not describe all possible interactions. Give your health care provider a list of all the medicines, herbs, non-prescription drugs, or dietary supplements you use. Also tell them if you smoke, drink alcohol, or use illegal drugs. Some items may interact with your medicine. What should I watch for while using this medicine? This product does not protect you against HIV infection (AIDS) or other sexually transmitted diseases. You should be able to feel the implant by pressing your fingertips over the skin where it was inserted. Contact your doctor if you cannot feel the implant, and use a non-hormonal birth control method (such as condoms) until your doctor confirms that the implant is in place. If you feel that the implant may have broken or become bent while in your arm, contact your healthcare provider. What side effects may I notice from receiving this medicine? Side effects that you should report to your doctor or health care professional as soon as possible: -allergic reactions like skin rash, itching or hives, swelling of the face, lips, or tongue -breast lumps -changes in emotions or moods -depressed mood -heavy or prolonged menstrual bleeding -pain, irritation, swelling, or bruising at the insertion site -scar at site of insertion -signs of infection at the insertion site such as fever, and skin redness, pain or discharge -signs of pregnancy -signs and symptoms of a blood clot such as breathing problems; changes in vision; chest pain; severe, sudden headache; pain, swelling, warmth in  the leg; trouble speaking; sudden numbness or weakness of the face, arm or  leg -signs and symptoms of liver injury like dark yellow or brown urine; general ill feeling or flu-like symptoms; light-colored stools; loss of appetite; nausea; right upper belly pain; unusually weak or tired; yellowing of the eyes or skin -unusual vaginal bleeding, discharge -signs and symptoms of a stroke like changes in vision; confusion; trouble speaking or understanding; severe headaches; sudden numbness or weakness of the face, arm or leg; trouble walking; dizziness; loss of balance or coordination Side effects that usually do not require medical attention (report to your doctor or health care professional if they continue or are bothersome): -acne -back pain -breast pain -changes in weight -dizziness -general ill feeling or flu-like symptoms -headache -irregular menstrual bleeding -nausea -sore throat -vaginal irritation or inflammation This list may not describe all possible side effects. Call your doctor for medical advice about side effects. You may report side effects to FDA at 1-800-FDA-1088. Where should I keep my medicine? This drug is given in a hospital or clinic and will not be stored at home. NOTE: This sheet is a summary. It may not cover all possible information. If you have questions about this medicine, talk to your doctor, pharmacist, or health care provider.  2018 [employer]/Gold Standard (2015-07-11 11:19:22) Ethinyl Estradiol; Norelgestromin skin patches What is this medicine? ETHINYL ESTRADIOL;NORELGESTROMIN (ETH in il es tra DYE ole; nor el JES troe min) skin patch is used as a contraceptive (birth control method). This medicine combines two types of female hormones, an estrogen and a progestin. This patch is used to prevent ovulation and pregnancy. This medicine may be used for other purposes; ask your health care provider or pharmacist if you have questions. COMMON BRAND NAME(S): Ortho Christianne Borrow What should I tell my health care provider before I take this  medicine? They need to know if you have or ever had any of these conditions: -abnormal vaginal bleeding -blood vessel disease or blood clots -breast, cervical, endometrial, ovarian, liver, or uterine cancer -diabetes -gallbladder disease -heart disease or recent heart attack -high blood pressure -high cholesterol -kidney disease -liver disease -migraine headaches -stroke -systemic lupus erythematosus (SLE) -tobacco smoker -an unusual or allergic reaction to estrogens, progestins, other medicines, foods, dyes, or preservatives -pregnant or trying to get pregnant -breast-feeding How should I use this medicine? This patch is applied to the skin. Follow the directions on the prescription label. Apply to clean, dry, healthy skin on the buttock, abdomen, upper outer arm or upper torso, in a place where it will not be rubbed by tight clothing. Do not use lotions or other cosmetics on the site where the patch will go. Press the patch firmly in place for 10 seconds to ensure good contact with the skin. Change the patch every 7 days on the same day of the week for 3 weeks. You will then have a break from the patch for 1 week, after which you will apply a new patch. Do not use your medicine more often than directed. Contact your pediatrician regarding the use of this medicine in children. Special care may be needed. This medicine has been used in female children who have started having menstrual periods. A patient package insert for the product will be given with each prescription and refill. Read this sheet carefully each time. The sheet may change frequently. Overdosage: If you think you have taken too much of this medicine contact a poison control center or emergency room at once.  NOTE: This medicine is only for you. Do not share this medicine with others. What if I miss a dose? You will need to replace your patch once a week as directed. If your patch is lost or falls off, contact your health care  professional for advice. You may need to use another form of birth control if your patch has been off for more than 1 day. What may interact with this medicine? Do not take this medicine with the following medication: -dasabuvir; ombitasvir; paritaprevir; ritonavir -ombitasvir; paritaprevir; ritonavir This medicine may also interact with the following medications: -acetaminophen -antibiotics or medicines for infections, especially rifampin, rifabutin, rifapentine, and griseofulvin, and possibly penicillins or tetracyclines -aprepitant -ascorbic acid (vitamin C) -atorvastatin -barbiturate medicines, such as phenobarbital -bosentan -carbamazepine -caffeine -clofibrate -cyclosporine -dantrolene -doxercalciferol -felbamate -grapefruit juice -hydrocortisone -medicines for anxiety or sleeping problems, such as diazepam or temazepam -medicines for diabetes, including pioglitazone -modafinil -mycophenolate -nefazodone -oxcarbazepine -phenytoin -prednisolone -ritonavir or other medicines for HIV infection or AIDS -rosuvastatin -selegiline -soy isoflavones supplements -St. John's wort -tamoxifen or raloxifene -theophylline -thyroid hormones -topiramate -warfarin This list may not describe all possible interactions. Give your health care provider a list of all the medicines, herbs, non-prescription drugs, or dietary supplements you use. Also tell them if you smoke, drink alcohol, or use illegal drugs. Some items may interact with your medicine. What should I watch for while using this medicine? Visit your doctor or health care professional for regular checks on your progress. You will need a regular breast and pelvic exam and Pap smear while on this medicine. Use an additional method of contraception during the first cycle that you use this patch. If you have any reason to think you are pregnant, stop using this medicine right away and contact your doctor or health care  professional. If you are using this medicine for hormone related problems, it may take several cycles of use to see improvement in your condition. Smoking increases the risk of getting a blood clot or having a stroke while you are using hormonal birth control, especially if you are more than 21 years old. You are strongly advised not to smoke. This medicine can make your body retain fluid, making your fingers, hands, or ankles swell. Your blood pressure can go up. Contact your doctor or health care professional if you feel you are retaining fluid. This medicine can make you more sensitive to the sun. Keep out of the sun. If you cannot avoid being in the sun, wear protective clothing and use sunscreen. Do not use sun lamps or tanning beds/booths. If you wear contact lenses and notice visual changes, or if the lenses begin to feel uncomfortable, consult your eye care specialist. In some women, tenderness, swelling, or minor bleeding of the gums may occur. Notify your dentist if this happens. Brushing and flossing your teeth regularly may help limit this. See your dentist regularly and inform your dentist of the medicines you are taking. If you are going to have elective surgery or a MRI, you may need to stop using this medicine before the surgery or MRI. Consult your health care professional for advice. This medicine does not protect you against HIV infection (AIDS) or any other sexually transmitted diseases. What side effects may I notice from receiving this medicine? Side effects that you should report to your doctor or health care professional as soon as possible: -breast tissue changes or discharge -changes in vaginal bleeding during your period or between your periods -chest pain -coughing up  blood -dizziness or fainting spells -headaches or migraines -leg, arm or groin pain -severe or sudden headaches -stomach pain (severe) -sudden shortness of breath -sudden loss of coordination, especially  on one side of the body -speech problems -symptoms of vaginal infection like itching, irritation or unusual discharge -tenderness in the upper abdomen -vomiting -weakness or numbness in the arms or legs, especially on one side of the body -yellowing of the eyes or skin Side effects that usually do not require medical attention (report to your doctor or health care professional if they continue or are bothersome): -breakthrough bleeding and spotting that continues beyond the 3 initial cycles of pills -breast tenderness -mood changes, anxiety, depression, frustration, anger, or emotional outbursts -increased sensitivity to sun or ultraviolet light -nausea -skin rash, acne, or brown spots on the skin -weight gain (slight) This list may not describe all possible side effects. Call your doctor for medical advice about side effects. You may report side effects to FDA at 1-800-FDA-1088. Where should I keep my medicine? Keep out of the reach of children. Store at room temperature between 15 and 30 degrees C (59 and 86 degrees F). Keep the patch in its pouch until time of use. Throw away any unused medicine after the expiration date. Dispose of used patches properly. Since a used patch may still contain active hormones, fold the patch in half so that it sticks to itself prior to disposal. Throw away in a place where children or pets cannot reach. NOTE: This sheet is a summary. It may not cover all possible information. If you have questions about this medicine, talk to your doctor, pharmacist, or health care provider.  2018 [employer]/Gold Standard (2015-09-02 07:59:03)

## 2016-08-30 NOTE — Progress Notes (Signed)
OB/GYN CONFERENCE NOTE:  Subjective:       Madison Jenkins is a 21 y.o. G53P1011 female who presents for a conference appointment with significant other.   She would like to discuss contraception. This is her second visit for contraception counseling. Previously seen 12/2015.   Denies difficulty breathing or respiratory distress, chest pain, abdominal pain, excessive vaginal bleeding, dysuria, and leg pain or swelling.   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   Gynecologic History  Patient's last menstrual period was 08/18/2016 (exact date).  Contraception: none  Obstetric History OB History  Gravida Para Term Preterm AB Living  2 1 1   1 1   SAB TAB Ectopic Multiple Live Births  1     0 1    # Outcome Date GA Lbr Len/2nd Weight Sex Delivery Anes PTL Lv  2 Term 02/27/15 [redacted]w[redacted]d 02:21 / 00:26 7 lb 8.6 oz (3.42 kg) F Vag-Spont EPI, Local  LIV  1 SAB               Past Medical History:  Diagnosis Date  . ADD (attention deficit disorder)    ADHD as per pt  . Asthma   . Depression   . Eczema   . Headache   . High cholesterol   . Murmur   . Orthostatic hypotension     Past Surgical History:  Procedure Laterality Date  . TONSILLECTOMY AND ADENOIDECTOMY      No current outpatient prescriptions on file prior to visit.   No current facility-administered medications on file prior to visit.     Allergies  Allergen Reactions  . Penicillins Anaphylaxis  . Sulfa Antibiotics Anaphylaxis  . Amoxicillin Rash    Social History   Social History  . Marital status: Married    Spouse name: N/A  . Number of children: N/A  . Years of education: N/A   Occupational History  . Not on file.   Social History Main Topics  . Smoking status: Former Smoker    Packs/day: 0.50    Years: 0.50    Quit date: 05/06/2015  . Smokeless tobacco: Never Used  . Alcohol use No  . Drug use: No  . Sexual activity: Yes    Birth control/  protection: None   Other Topics Concern  . Not on file   Social History Narrative  . No narrative on file    Family History  Problem Relation Age of Onset  . Breast cancer Paternal Grandmother   . Cancer - Other Mother        ovarian  . Thyroid disease Mother     The following portions of the patient's history were reviewed and updated as appropriate: allergies, current medications, past family history, past medical history, past social history, past surgical history and problem list.  Review of Systems  Pertinent items are noted in HPI.   Objective:   BP 120/76   Pulse 86   Ht 5\' 9"  (1.753 m)   Wt 190 lb 9 oz (86.4 kg)   LMP 08/18/2016 (Exact Date)   BMI 28.14 kg/m    Physical Exam: Not indicated.   Assessment:   Patient Active Problem List   Diagnosis Date Noted  . Back pain 02/23/2015   Encounter for contraception counseling   Plan:   Reviewed all forms of birth control options available including abstinence; over the counter/barrier methods; hormonal contraceptive medication including pill, patch, ring, injection,contraceptive implant; hormonal and nonhormonal  IUDs; permanent sterilization options including vasectomy and the various tubal sterilization modalities. Risks and benefits reviewed.  Questions were answered.  Information was given to patient to review.   Time: 15 minutes  RTC as needed   Gunnar Bulla, CNM [facility]

## 2016-09-01 ENCOUNTER — Encounter: Payer: Self-pay | Admitting: Certified Nurse Midwife

## 2016-09-01 ENCOUNTER — Ambulatory Visit (INDEPENDENT_AMBULATORY_CARE_PROVIDER_SITE_OTHER): Payer: Medicaid Other | Admitting: Certified Nurse Midwife

## 2016-09-01 VITALS — BP 122/80 | HR 89 | Wt 193.1 lb

## 2016-09-01 DIAGNOSIS — N921 Excessive and frequent menstruation with irregular cycle: Secondary | ICD-10-CM | POA: Diagnosis not present

## 2016-09-01 MED ORDER — NORELGESTROMIN-ETH ESTRADIOL 150-35 MCG/24HR TD PTWK: 1.0000 | MEDICATED_PATCH | TRANSDERMAL | 12 refills | Status: DC

## 2016-09-01 NOTE — Patient Instructions (Signed)

## 2016-09-02 ENCOUNTER — Telehealth: Payer: Self-pay | Admitting: Certified Nurse Midwife

## 2016-09-02 LAB — BETA HCG QUANT (REF LAB): hCG Quant: <1 m[IU]/mL

## 2016-09-02 NOTE — Telephone Encounter (Signed)
Pt called and request a call back to get her lab results. Please advise. Thanks TNP

## 2016-09-06 ENCOUNTER — Encounter: Payer: Self-pay | Admitting: Certified Nurse Midwife

## 2016-09-13 NOTE — Progress Notes (Signed)
GYN ENCOUNTER NOTE  Subjective:       Madison Jenkins is a 21 y.o. G71P1011 female here for evaluation of abnormal uterine bleeding.   Reports negative home pregnancy test. Vaginal bleeding for the last 11 to 16 days with intermittent lower abdominal and back pain. Wearing pads and changing every two (2) to three (3) hours.   Evaluated yesterday at the [facility] clinic and given Provera 10 mg PO, but has not started taking the medications. She was also evaluated for UTI and reports negative results. H/H was within normal limits.   Denies difficulty breathing or respiratory distress, chest pain, dysuria, and leg pain or swelling.    Gynecologic History  Patient's last menstrual period was 08/18/2016 (exact date).  Contraception: none  Last Pap: due.  Obstetric History  OB History  Gravida Para Term Preterm AB Living  SAB TAB Ectopic Multiple Live Births  1     0 1    # Outcome Date GA Lbr Len/2nd Weight Sex Delivery Anes PTL Lv  2 Term 02/27/15 [redacted]w[redacted]d 02:21 / 00:26 7 lb 8.6 oz (3.42 kg) F Vag-Spont EPI, Local  LIV  1 SAB               Past Medical History:  Diagnosis Date  . ADD (attention deficit disorder)    ADHD as per pt  . Asthma   . Depression   . Eczema   . Headache   . High cholesterol   . Murmur   . Orthostatic hypotension     Past Surgical History:  Procedure Laterality Date  . TONSILLECTOMY AND ADENOIDECTOMY      Current Outpatient Prescriptions on File Prior to Visit  Medication Sig Dispense Refill  . ciprofloxacin (CIPRO) 500 MG tablet Take 500 mg by mouth 2 (two) times daily.  0  . DULoxetine (CYMBALTA) 60 MG capsule Take by mouth daily.  2  . PROAIR HFA 108 (90 Base) MCG/ACT inhaler 2 INH INHALATION EVERY FOUR TO SIX HOURS AS NEEDED  5   No current facility-administered medications on file prior to visit.     Allergies  Allergen Reactions  . Penicillins Anaphylaxis  . Sulfa Antibiotics Anaphylaxis  . Amoxicillin Rash    Social  History   Social History  . Marital status: Married    Spouse name: N/A  . Number of children: N/A  . Years of education: N/A   Occupational History  . Not on file.   Social History Main Topics  . Smoking status: Former Smoker    Packs/day: 0.50    Years: 0.50    Quit date: 05/06/2015  . Smokeless tobacco: Never Used  . Alcohol use No  . Drug use: No  . Sexual activity: Yes    Birth control/ protection: None   Other Topics Concern  . Not on file   Social History Narrative  . No narrative on file    Family History  Problem Relation Age of Onset  . Breast cancer Paternal Grandmother   . Cancer - Other Mother        ovarian  . Thyroid disease Mother     The following portions of the patient's history were reviewed and updated as appropriate: allergies, current medications, past family history, past medical history, past social history, past surgical history and problem list.  Review of Systems  Review of Systems - Negative except as noted above.  History obtained from the patient.  Objective:   BP 122/80   Pulse 89   Wt 193 lb 1.6 oz (87.6 kg)   LMP 08/18/2016 (Exact Date)   BMI 28.52 kg/m   Alert and oriented x 4, no apparent distress.   Physical exam: not indicated.   Assessment:   1. Menorrhagia with irregular cycle  - Beta HCG, Quant   Plan:   Labs: beta, see orders.   Encouraged patient to take Provera as prescribed.   Reviewed all forms of birth control options available including abstinence; over the counter/barrier methods; hormonal contraceptive medication including pill, patch, ring, injection,contraceptive implant; hormonal and nonhormonal IUDs; permanent sterilization options including vasectomy and the various tubal sterilization modalities. Risks and benefits reviewed.  Questions were answered.  Information was given to patient to review.   Rx: Burr MedicoXulane, see orders.   RTC if symptoms worsen or fail to improve.    Gunnar BullaJenkins Michelle  Lawhorn, CNM

## 2016-09-14 ENCOUNTER — Emergency Department: Payer: Medicaid Other

## 2016-09-14 ENCOUNTER — Emergency Department
Admission: EM | Admit: 2016-09-14 | Discharge: 2016-09-14 | Disposition: A | Discharge status: Home / Self Care | Payer: Medicaid Other | Attending: Emergency Medicine | Admitting: Emergency Medicine

## 2016-09-14 DIAGNOSIS — J45909 Unspecified asthma, uncomplicated: Secondary | ICD-10-CM | POA: Insufficient documentation

## 2016-09-14 DIAGNOSIS — N938 Other specified abnormal uterine and vaginal bleeding: Secondary | ICD-10-CM | POA: Diagnosis present

## 2016-09-14 DIAGNOSIS — Z79899 Other long term (current) drug therapy: Secondary | ICD-10-CM | POA: Diagnosis not present

## 2016-09-14 DIAGNOSIS — R109 Unspecified abdominal pain: Secondary | ICD-10-CM | POA: Diagnosis not present

## 2016-09-14 DIAGNOSIS — Z87891 Personal history of nicotine dependence: Secondary | ICD-10-CM | POA: Diagnosis not present

## 2016-09-14 DIAGNOSIS — N921 Excessive and frequent menstruation with irregular cycle: Secondary | ICD-10-CM | POA: Insufficient documentation

## 2016-09-14 LAB — BASIC METABOLIC PANEL
ANION GAP: 7 (ref 5–15)
BUN: 12 mg/dL (ref 6–20)
CHLORIDE: 105 mmol/L (ref 101–111)
CO2: 26 mmol/L (ref 22–32)
Calcium: 10 mg/dL (ref 8.9–10.3)
Creatinine, Ser: 0.8 mg/dL (ref 0.44–1.00)
GFR calc non Af Amer: >60 mL/min (ref >60)
Glucose, Bld: 98 mg/dL (ref 65–99)
POTASSIUM: 4.4 mmol/L (ref 3.5–5.1)
Sodium: 138 mmol/L (ref 135–145)

## 2016-09-14 LAB — URINALYSIS, COMPLETE (UACMP) WITH MICROSCOPIC
Bacteria, UA: NONE SEEN
Bilirubin Urine: NEGATIVE
Glucose, UA: NEGATIVE mg/dL
Ketones, ur: NEGATIVE mg/dL
Nitrite: NEGATIVE
PH: 5 (ref 5.0–8.0)
Protein, ur: NEGATIVE mg/dL
SPECIFIC GRAVITY, URINE: 1.019 (ref 1.005–1.030)

## 2016-09-14 LAB — CBC
HCT: 40.4 % (ref 35.0–47.0)
HEMOGLOBIN: 13.9 g/dL (ref 12.0–16.0)
MCH: 28.6 pg (ref 26.0–34.0)
MCHC: 34.4 g/dL (ref 32.0–36.0)
MCV: 83.1 fL (ref 80.0–100.0)
Platelets: 265 10*3/uL (ref 150–440)
RBC: 4.87 MIL/uL (ref 3.80–5.20)
RDW: 13.7 % (ref 11.5–14.5)
WBC: 7.4 10*3/uL (ref 3.6–11.0)

## 2016-09-14 LAB — POCT PREGNANCY, URINE: Preg Test, Ur: NEGATIVE

## 2016-09-14 LAB — TSH: TSH: 1.744 u[IU]/mL (ref 0.350–4.500)

## 2016-09-14 MED ORDER — TRANEXAMIC ACID 650 MG PO TABS: 1300.0000 mg | ORAL_TABLET | Freq: Three times a day (TID) | ORAL | 0 refills | Status: AC

## 2016-09-14 MED ORDER — TRANEXAMIC ACID 650 MG PO TABS
1300.0000 mg | ORAL_TABLET | Freq: Once | ORAL | Status: AC
  Administered 2016-09-14: 1300 mg via ORAL
  Filled 2016-09-14: qty 2

## 2016-09-14 MED ORDER — NAPROXEN 500 MG PO TABS
500.0000 mg | ORAL_TABLET | Freq: Once | ORAL | Status: AC
  Administered 2016-09-14: 500 mg via ORAL
  Filled 2016-09-14: qty 1

## 2016-09-14 NOTE — ED Notes (Signed)
Patient transported to CT 

## 2016-09-14 NOTE — Discharge Instructions (Signed)
Please take your tranexamic acid for 5 days as prescribed and make an appointment to follow-up with your OB gynecologist within the next week for reevaluation. Return to the emergency department sooner for any concerns.  It was a pleasure to take care of you today, and thank you for coming to our emergency department.  If you have any questions or concerns before leaving please ask the nurse to grab me and I'm more than happy to go through your aftercare instructions again.  If you were prescribed any opioid pain medication today such as Norco, Vicodin, Percocet, morphine, hydrocodone, or oxycodone please make sure you do not drive when you are taking this medication as it can alter your ability to drive safely.  If you have any concerns once you are home that you are not improving or are in fact getting worse before you can make it to your follow-up appointment, please do not hesitate to call 911 and come back for further evaluation.  Merrily BrittleNeil Vitor Overbaugh, MD  Results for orders placed or performed during the hospital encounter of 09/14/16  Urinalysis, Complete w Microscopic  Result Value Ref Range   Color, Urine YELLOW (A) YELLOW   APPearance HAZY (A) CLEAR   Specific Gravity, Urine 1.019 1.005 - 1.030   pH 5.0 5.0 - 8.0   Glucose, UA NEGATIVE NEGATIVE mg/dL   Hgb urine dipstick LARGE (A) NEGATIVE   Bilirubin Urine NEGATIVE NEGATIVE   Ketones, ur NEGATIVE NEGATIVE mg/dL   Protein, ur NEGATIVE NEGATIVE mg/dL   Nitrite NEGATIVE NEGATIVE   Leukocytes, UA TRACE (A) NEGATIVE   RBC / HPF TOO NUMEROUS TO COUNT 0 - 5 RBC/hpf   WBC, UA 6-30 0 - 5 WBC/hpf   Bacteria, UA NONE SEEN NONE SEEN   Squamous Epithelial / LPF 0-5 (A) NONE SEEN   Mucus PRESENT   Basic metabolic panel  Result Value Ref Range   Sodium 138 135 - 145 mmol/L   Potassium 4.4 3.5 - 5.1 mmol/L   Chloride 105 101 - 111 mmol/L   CO2 26 22 - 32 mmol/L   Glucose, Bld 98 65 - 99 mg/dL   BUN 12 6 - 20 mg/dL   Creatinine, Ser 1.610.80  0.44 - 1.00 mg/dL   Calcium 09.610.0 8.9 - 04.510.3 mg/dL   GFR calc non Af Amer >60 >60 mL/min   GFR calc Af Amer >60 >60 mL/min   Anion gap 7 5 - 15  CBC  Result Value Ref Range   WBC 7.4 3.6 - 11.0 K/uL   RBC 4.87 3.80 - 5.20 MIL/uL   Hemoglobin 13.9 12.0 - 16.0 g/dL   HCT 40.940.4 81.135.0 - 91.447.0 %   MCV 83.1 80.0 - 100.0 fL   MCH 28.6 26.0 - 34.0 pg   MCHC 34.4 32.0 - 36.0 g/dL   RDW 78.213.7 95.611.5 - 21.314.5 %   Platelets 265 150 - 440 K/uL  Pregnancy, urine POC  Result Value Ref Range   Preg Test, Ur NEGATIVE NEGATIVE   Ct Renal Stone Study  Result Date: 09/14/2016 CLINICAL DATA:  Left flank pain x3 days. EXAM: CT ABDOMEN AND PELVIS WITHOUT CONTRAST TECHNIQUE: Multidetector CT imaging of the abdomen and pelvis was performed following the standard protocol without IV contrast. COMPARISON:  08/03/2013 CT FINDINGS: LOWER CHEST: Lung bases are clear. Included heart size is normal. No pericardial effusion. HEPATOBILIARY: Liver and gallbladder are normal. PANCREAS: Normal. SPLEEN: Normal. ADRENALS/URINARY TRACT: Kidneys are orthotopic. No nephrolithiasis, hydronephrosis or solid renal masses. The unopacified  ureters are normal in course and caliber. Urinary bladder is partially distended and unremarkable. Normal adrenal glands. STOMACH/BOWEL: The stomach, small and large bowel are normal in course and caliber without inflammatory changes. Normal appendix. VASCULAR/LYMPHATIC: Aortoiliac vessels are normal in course and caliber. No lymphadenopathy by CT size criteria. REPRODUCTIVE: Normal. OTHER: No intraperitoneal free fluid or free air. Tiny fat containing umbilical hernia. MUSCULOSKELETAL: Nonacute. IMPRESSION: 1. No obstructive uropathy or nephrolithiasis. 2. No bowel obstruction or inflammation. 3. Tiny fat containing umbilical hernia. Electronically Signed   By: Tollie Eth M.D.   On: 09/14/2016 19:19

## 2016-09-14 NOTE — ED Provider Notes (Signed)
[facility] Emergency Department Provider Note  ____________________________________________   First MD Initiated Contact with Patient 09/14/16 1824     (approximate)  I have reviewed the triage vital signs and the nursing notes.   HISTORY  Chief Complaint Flank Pain and Vaginal Bleeding   HPI Madison Jenkins is a 21 y.o. female who comes to the emergency department with 3 days of moderate severity sudden onset left flank pain radiating towards her left groin. She also reports roughly 1 month of metromenorrhagia. She reports using roughly one tampon an hour. She has seen her OB gynecologist who is given her a trial of progesterone without improvement. She came to the emergency department today because of the acute onset of new flank pain. No fevers or chills. No dysuria frequency or hesitancy. Nothing seems to make the pain better or worse.   Past Medical History:  Diagnosis Date  . ADD (attention deficit disorder)    ADHD as per pt  . Asthma   . Depression   . Eczema   . Headache   . High cholesterol   . Murmur   . Orthostatic hypotension     Patient Active Problem List   Diagnosis Date Noted  . Back pain 02/23/2015    Past Surgical History:  Procedure Laterality Date  . TONSILLECTOMY AND ADENOIDECTOMY      Prior to Admission medications   Medication Sig Start Date End Date Taking? Authorizing Provider  ciprofloxacin (CIPRO) 500 MG tablet Take 500 mg by mouth 2 (two) times daily. 08/19/16   [provider]  DULoxetine (CYMBALTA) 60 MG capsule Take by mouth daily. 08/19/16   [provider]  norelgestromin-ethinyl estradiol (ORTHO EVRA) 150-35 MCG/24HR transdermal patch Place 1 patch onto the skin once a week. 09/01/16   Gunnar Bulla, CNM  PROAIR HFA 108 [phone] Base) MCG/ACT inhaler 2 INH INHALATION EVERY FOUR TO SIX HOURS AS NEEDED 08/13/16   [provider]  tranexamic acid (LYSTEDA) 650 MG TABS tablet Take 2  tablets (1,300 mg total) by mouth 3 (three) times daily. 09/14/16 09/19/16  Merrily Brittle, MD    Allergies Penicillins; Sulfa antibiotics; and Amoxicillin  Family History  Problem Relation Age of Onset  . Breast cancer Paternal Grandmother   . Cancer - Other Mother        ovarian  . Thyroid disease Mother     Social History Social History  Substance Use Topics  . Smoking status: Former Smoker    Packs/day: 0.50    Years: 0.50    Quit date: 05/06/2015  . Smokeless tobacco: Never Used  . Alcohol use No    Review of Systems Constitutional: No fever/chills Eyes: No visual changes. ENT: No sore throat. Cardiovascular: Denies chest pain. Respiratory: Denies shortness of breath. Gastrointestinal: Positive abdominal pain.  No nausea, no vomiting.  No diarrhea.  No constipation. Genitourinary: Negative for dysuria. Musculoskeletal: Positive for back pain. Skin: Negative for rash. Neurological: Negative for headaches, focal weakness or numbness.   ____________________________________________   PHYSICAL EXAM:  VITAL SIGNS: ED Triage Vitals  Enc Vitals Group     BP 09/14/16 1805 118/82     Pulse Rate 09/14/16 1805 98     Resp 09/14/16 1805 18     Temp 09/14/16 1805 98.3 F (36.8 C)     Temp Source 09/14/16 1805 Oral     SpO2 09/14/16 1805 98 %     Weight 09/14/16 1806 190 lb (86.2 kg)  Height 09/14/16 1806 5\' 8"  (1.727 m)     Head Circumference --      Peak Flow --      Pain Score 09/14/16 1805 8     Pain Loc --      Pain Edu? --      Excl. in GC? --     Constitutional: Alert and oriented 4 pleasant cooperative speaks in full clear sentences no diaphoresis Eyes: PERRL EOMI. Head: Atraumatic. Nose: No congestion/rhinnorhea. Mouth/Throat: No trismus Neck: No stridor.   Cardiovascular: Normal rate, regular rhythm. Grossly normal heart sounds.  Good peripheral circulation. Respiratory: Normal respiratory effort.  No retractions. Lungs CTAB and moving good  air Gastrointestinal: Soft nondistended nontender no rebound or guarding no peritonitis no McBurney's tenderness no costovertebral tenderness Musculoskeletal: No lower extremity edema   Neurologic:  Normal speech and language. No gross focal neurologic deficits are appreciated. Skin:  Skin is warm, dry and intact. No rash noted. Psychiatric: Mood and affect are normal. Speech and behavior are normal.    ____________________________________________   DIFFERENTIAL includes but not limited to  Renal colic, pyelonephritis, urinary tract infection, metromenorrhagia, uterine fibroids ____________________________________________   LABS (all labs ordered are listed, but only abnormal results are displayed)  Labs Reviewed  URINALYSIS, COMPLETE (UACMP) WITH MICROSCOPIC - Abnormal; Notable for the following:       Result Value   Color, Urine YELLOW (*)    APPearance HAZY (*)    Hgb urine dipstick LARGE (*)    Leukocytes, UA TRACE (*)    Squamous Epithelial / LPF 0-5 (*)    All other components within normal limits  BASIC METABOLIC PANEL  CBC  TSH  POCT PREGNANCY, URINE    Hematuria with no evidence of infection __________________________________________  EKG   ____________________________________________  RADIOLOGY  No evidence of renal stone ____________________________________________   PROCEDURES  Procedure(s) performed: no  Procedures  Critical Care performed: no  Observation: no ____________________________________________   INITIAL IMPRESSION / ASSESSMENT AND PLAN / ED COURSE  Pertinent labs & imaging results that were available during my care of the patient were reviewed by me and considered in my medical decision making (see chart for details).  The patient arrives hemodynamically stable and well appearing. She does report 1 month of daily vaginal bleeding although fortunately she is not anemic. She already has completed a trial of progestin since today L  give her a trial of tranexamic acid instead. She does have an acute onset is sudden left flank pain and the blood in her urine from her metromenorrhagia could be masking a kidney stone. CT scan performed which is fortunately negative for acute disease. At this point I will refer her back to her OB gynecologist. Strict return precautions given.      ____________________________________________   FINAL CLINICAL IMPRESSION(S) / ED DIAGNOSES  Final diagnoses:  Menometrorrhagia      NEW MEDICATIONS STARTED DURING THIS VISIT:  Discharge Medication List as of 09/14/2016  7:44 PM    START taking these medications   Details  tranexamic acid (LYSTEDA) 650 MG TABS tablet Take 2 tablets (1,300 mg total) by mouth 3 (three) times daily., Starting Mon 09/14/2016, Until Sat 09/19/2016, Print         Note:  This document was prepared using Dragon voice recognition software and may include unintentional dictation errors.     Merrily Brittleifenbark, Lacora Folmer, MD 09/15/16 2201

## 2016-09-14 NOTE — ED Triage Notes (Signed)
Pt states she has had vaginal bleeding since 8/14 and in the past couple of days having left flank pain. States she has been seeing a GYN ..Marland Kitchen

## 2016-10-02 ENCOUNTER — Ambulatory Visit (INDEPENDENT_AMBULATORY_CARE_PROVIDER_SITE_OTHER): Payer: Medicaid Other | Admitting: Certified Nurse Midwife

## 2016-10-02 ENCOUNTER — Encounter: Payer: Self-pay | Admitting: Certified Nurse Midwife

## 2016-10-02 VITALS — BP 130/75 | HR 95 | Ht 68.0 in | Wt 193.4 lb

## 2016-10-02 DIAGNOSIS — Z3169 Encounter for other general counseling and advice on procreation: Secondary | ICD-10-CM | POA: Diagnosis not present

## 2016-10-02 DIAGNOSIS — N939 Abnormal uterine and vaginal bleeding, unspecified: Secondary | ICD-10-CM | POA: Diagnosis not present

## 2016-10-02 NOTE — Progress Notes (Signed)
Pt was using OCPs but bled and then tried the patch but bled heavier. She is here to consult about conceiving.

## 2016-10-02 NOTE — Progress Notes (Signed)
GYN ENCOUNTER NOTE  Subjective:       Madison Jenkins is a 21 y.o. G50P1011 female here for preconception counseling.   Patient was seen in office on 09/01/2016 for contraception counseling and management of AUB, but now desires pregnancy.   Reports spotting for three (3) days after ER visit on 09/14/2016.  Denies difficulty breathing or respiratory distress, chest pain, abdominal pain, vaginal bleeding, and leg pain or swelling.    Gynecologic History  Patient's last menstrual period was 08/18/2016 (exact date).  Contraception: Ortho-Evra patches weekly  Last Pap: due.   Obstetric History  OB History  Gravida Para Term Preterm AB Living  SAB TAB Ectopic Multiple Live Births  1     0 1    # Outcome Date GA Lbr Len/2nd Weight Sex Delivery Anes PTL Lv  2 Term 02/27/15 [redacted]w[redacted]d 02:21 / 00:26 7 lb 8.6 oz (3.42 kg) F Vag-Spont EPI, Local  LIV  1 SAB               Past Medical History:  Diagnosis Date  . ADD (attention deficit disorder)    ADHD as per pt  . Asthma   . Depression   . Eczema   . Headache   . High cholesterol   . Murmur   . Orthostatic hypotension     Past Surgical History:  Procedure Laterality Date  . TONSILLECTOMY AND ADENOIDECTOMY      Allergies  Allergen Reactions  . Penicillins Anaphylaxis  . Sulfa Antibiotics Anaphylaxis  . Amoxicillin Rash    Social History   Social History  . Marital status: Married    Spouse name: N/A  . Number of children: N/A  . Years of education: N/A   Occupational History  . Not on file.   Social History Main Topics  . Smoking status: Current Every Day Smoker    Packs/day: 0.50    Years: 0.50    Last attempt to quit: 05/06/2015  . Smokeless tobacco: Never Used  . Alcohol use Yes     Comment: occas  . Drug use: No  . Sexual activity: Yes    Birth control/ protection: None   Other Topics Concern  . Not on file   Social History Narrative  . No narrative on file    Family History   Problem Relation Age of Onset  . Breast cancer Paternal Grandmother   . Cancer - Other Mother        ovarian  . Thyroid disease Mother   . Lupus Mother   . Graves' disease Mother     The following portions of the patient's history were reviewed and updated as appropriate: allergies, current medications, past family history, past medical history, past social history, past surgical history and problem list.  Review of Systems  Review of Systems - Negative except as noted above.  History obtained from the patient.  Objective:   BP 130/75   Pulse 95   Ht  (1.727 m)   Wt 193 lb 7 oz (87.7 kg)   LMP 08/18/2016 (Exact Date)   Breastfeeding? No   BMI 29.41 kg/m      Assessment:   1. Encounter for preconception consultation   2. Abnormal uterine bleeding  - Beta HCG, Quant   Plan:   Discussed ovulation tracking, optimization of personal health, well timed sex, and rest after intercourse.   Labs: Beta, see orders.   Reviewed red flag  symptoms and when to call.   RTC as needed.    Gunnar Bulla, CNM

## 2016-10-02 NOTE — Patient Instructions (Signed)
Preparing for Pregnancy If you are considering becoming pregnant, make an appointment to see your regular health care provider to learn how to prepare for a safe and healthy pregnancy (preconception care). During a preconception care visit, your health care provider will:  Do a complete physical exam, including a Pap test.  Take a complete medical history.  Give you information, answer your questions, and help you resolve problems.  Preconception checklist Medical history  Tell your health care provider about any current or past medical conditions. Your pregnancy or your ability to become pregnant may be affected by chronic conditions, such as diabetes, chronic hypertension, and thyroid problems.  Include your family's medical history as well as your partner's medical history.  Tell your health care provider about any history of STIs (sexually transmitted infections).These can affect your pregnancy. In some cases, they can be passed to your baby. Discuss any concerns that you have about STIs.  If indicated, discuss the benefits of genetic testing. This testing will show whether there are any genetic conditions that may be passed from you or your partner to your baby.  Tell your health care provider about: ? Any problems you have had with conception or pregnancy. ? Any medicines you take. These include vitamins, herbal supplements, and over-the-counter medicines. ? Your history of immunizations. Discuss any vaccinations that you may need.  Diet  Ask your health care provider what to include in a healthy diet that has a balance of nutrients. This is especially important when you are pregnant or preparing to become pregnant.  Ask your health care provider to help you reach a healthy weight before pregnancy. ? If you are overweight, you may be at higher risk for certain complications, such as high blood pressure, diabetes, and preterm birth. ? If you are underweight, you are more likely  to have a baby who has a low birth weight.  Lifestyle, work, and home  Let your health care provider know: ? About any lifestyle habits that you have, such as alcohol use, drug use, or smoking. ? About recreational activities that may put you at risk during pregnancy, such as downhill skiing and certain exercise programs. ? Tell your health care provider about any international travel, especially any travel to places with an active Zika virus outbreak. ? About harmful substances that you may be exposed to at work or at home. These include chemicals, pesticides, radiation, or even litter boxes. ? If you do not feel safe at home.  Mental health  Tell your health care provider about: ? Any history of mental health conditions, including feelings of depression, sadness, or anxiety. ? Any medicines that you take for a mental health condition. These include herbs and supplements.  Home instructions to prepare for pregnancy Lifestyle  Eat a balanced diet. This includes fresh fruits and vegetables, whole grains, lean meats, low-fat dairy products, healthy fats, and foods that are high in fiber. Ask to meet with a nutritionist or registered dietitian for assistance with meal planning and goals.  Get regular exercise. Try to be active for at least 30 minutes a day on most days of the week. Ask your health care provider which activities are safe during pregnancy.  Do not use any products that contain nicotine or tobacco, such as cigarettes and e-cigarettes. If you need help quitting, ask your health care provider.  Do not drink alcohol.  Do not take illegal drugs.  Maintain a healthy weight. Ask your health care provider what weight range is   right for you.  General instructions  Keep an accurate record of your menstrual periods. This makes it easier for your health care provider to determine your baby's due date.  Begin taking prenatal vitamins and folic acid supplements daily as directed by  your health care provider.  Manage any chronic conditions, such as high blood pressure and diabetes, as told by your health care provider. This is important.  How do I know that I am pregnant? You may be pregnant if you have been sexually active and you miss your period. Symptoms of early pregnancy include:  Mild cramping.  Very light vaginal bleeding (spotting).  Feeling unusually tired.  Nausea and vomiting (morning sickness).  If you have any of these symptoms and you suspect that you might be pregnant, you can take a home pregnancy test. These tests check for a hormone in your urine (human chorionic gonadotropin, or hCG). A woman's body begins to make this hormone during early pregnancy. These tests are very accurate. Wait until at least the first day after you miss your period to take one. If the test shows that you are pregnant (you get a positive result), call your health care provider to make an appointment for prenatal care. What should I do if I become pregnant?  Make an appointment with your health care provider as soon as you suspect you are pregnant.  Do not use any products that contain nicotine, such as cigarettes, chewing tobacco, and e-cigarettes. If you need help quitting, ask your health care provider.  Do not drink alcoholic beverages. Alcohol is related to a number of birth defects.  Avoid toxic odors and chemicals.  You may continue to have sexual intercourse if it does not cause pain or other problems, such as vaginal bleeding. This information is not intended to replace advice given to you by your health care provider. Make sure you discuss any questions you have with your health care provider. Document Released: 12/05/2007 Document Revised: 08/20/2015 Document Reviewed: 07/14/2015 Elsevier Interactive Patient Education  2017 Elsevier Inc.  

## 2016-10-03 LAB — BETA HCG QUANT (REF LAB)

## 2016-10-13 ENCOUNTER — Emergency Department
Admission: EM | Admit: 2016-10-13 | Discharge: 2016-10-13 | Disposition: A | Discharge status: Home / Self Care | Payer: Medicaid Other | Attending: Emergency Medicine | Admitting: Emergency Medicine

## 2016-10-13 ENCOUNTER — Emergency Department: Payer: Medicaid Other

## 2016-10-13 ENCOUNTER — Encounter: Payer: Self-pay | Admitting: Emergency Medicine

## 2016-10-13 DIAGNOSIS — S29012A Strain of muscle and tendon of back wall of thorax, initial encounter: Secondary | ICD-10-CM | POA: Diagnosis not present

## 2016-10-13 DIAGNOSIS — Z79899 Other long term (current) drug therapy: Secondary | ICD-10-CM | POA: Insufficient documentation

## 2016-10-13 DIAGNOSIS — Y939 Activity, unspecified: Secondary | ICD-10-CM | POA: Diagnosis not present

## 2016-10-13 DIAGNOSIS — S20409A Unspecified superficial injuries of unspecified back wall of thorax, initial encounter: Secondary | ICD-10-CM | POA: Diagnosis present

## 2016-10-13 DIAGNOSIS — Y999 Unspecified external cause status: Secondary | ICD-10-CM | POA: Insufficient documentation

## 2016-10-13 DIAGNOSIS — M542 Cervicalgia: Secondary | ICD-10-CM | POA: Diagnosis not present

## 2016-10-13 DIAGNOSIS — F17210 Nicotine dependence, cigarettes, uncomplicated: Secondary | ICD-10-CM | POA: Diagnosis not present

## 2016-10-13 DIAGNOSIS — Y92410 Unspecified street and highway as the place of occurrence of the external cause: Secondary | ICD-10-CM | POA: Diagnosis not present

## 2016-10-13 DIAGNOSIS — T148XXA Other injury of unspecified body region, initial encounter: Secondary | ICD-10-CM

## 2016-10-13 DIAGNOSIS — J45909 Unspecified asthma, uncomplicated: Secondary | ICD-10-CM | POA: Diagnosis not present

## 2016-10-13 DIAGNOSIS — S39012A Strain of muscle, fascia and tendon of lower back, initial encounter: Secondary | ICD-10-CM

## 2016-10-13 HISTORY — DX: Fatty (change of) liver, not elsewhere classified: K76.0

## 2016-10-13 LAB — COMPREHENSIVE METABOLIC PANEL
ALK PHOS: 71 U/L (ref 38–126)
ALT: 35 U/L (ref 14–54)
ANION GAP: 8 (ref 5–15)
AST: 32 U/L (ref 15–41)
Albumin: 4.3 g/dL (ref 3.5–5.0)
BILIRUBIN TOTAL: 0.6 mg/dL (ref 0.3–1.2)
BUN: 14 mg/dL (ref 6–20)
CALCIUM: 9.1 mg/dL (ref 8.9–10.3)
CO2: 26 mmol/L (ref 22–32)
CREATININE: 0.96 mg/dL (ref 0.44–1.00)
Chloride: 105 mmol/L (ref 101–111)
GFR calc non Af Amer: >60 mL/min (ref >60)
GLUCOSE: 116 mg/dL — AB (ref 65–99)
Potassium: 3.8 mmol/L (ref 3.5–5.1)
SODIUM: 139 mmol/L (ref 135–145)
TOTAL PROTEIN: 7.6 g/dL (ref 6.5–8.1)

## 2016-10-13 LAB — URINALYSIS, COMPLETE (UACMP) WITH MICROSCOPIC
BILIRUBIN URINE: NEGATIVE
Bacteria, UA: NONE SEEN
Glucose, UA: NEGATIVE mg/dL
Ketones, ur: NEGATIVE mg/dL
NITRITE: NEGATIVE
Protein, ur: 100 mg/dL — AB
SPECIFIC GRAVITY, URINE: 1.03 (ref 1.005–1.030)
pH: 7 (ref 5.0–8.0)

## 2016-10-13 LAB — CBC
HCT: 39.9 % (ref 35.0–47.0)
HEMOGLOBIN: 13.9 g/dL (ref 12.0–16.0)
MCH: 29 pg (ref 26.0–34.0)
MCHC: 34.8 g/dL (ref 32.0–36.0)
MCV: 83.3 fL (ref 80.0–100.0)
PLATELETS: 263 10*3/uL (ref 150–440)
RBC: 4.79 MIL/uL (ref 3.80–5.20)
RDW: 13.6 % (ref 11.5–14.5)
WBC: 8.5 10*3/uL (ref 3.6–11.0)

## 2016-10-13 LAB — LIPASE, BLOOD: Lipase: 25 U/L (ref 11–51)

## 2016-10-13 LAB — POCT PREGNANCY, URINE: Preg Test, Ur: NEGATIVE
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
## 2016-10-13 NOTE — Discharge Instructions (Signed)
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
## 2016-10-15 LAB — URINE CULTURE: SPECIAL REQUESTS: NORMAL

## 2016-10-16 ENCOUNTER — Encounter: Payer: Self-pay | Admitting: Certified Nurse Midwife

## 2016-10-23 ENCOUNTER — Encounter: Payer: Self-pay | Admitting: Certified Nurse Midwife

## 2017-02-25 ENCOUNTER — Other Ambulatory Visit: Payer: Self-pay | Admitting: Family Medicine

## 2017-02-25 DIAGNOSIS — M5416 Radiculopathy, lumbar region: Secondary | ICD-10-CM

## 2017-03-04 ENCOUNTER — Ambulatory Visit
Admission: RE | Admit: 2017-03-04 | Discharge: 2017-03-04 | Disposition: A | Discharge status: Home / Self Care | Payer: Medicaid Other | Source: Ambulatory Visit | Attending: Family Medicine | Admitting: Family Medicine

## 2017-03-04 DIAGNOSIS — M5416 Radiculopathy, lumbar region: Secondary | ICD-10-CM | POA: Diagnosis present

## 2017-03-04 DIAGNOSIS — M5127 Other intervertebral disc displacement, lumbosacral region: Secondary | ICD-10-CM | POA: Insufficient documentation

## 2017-03-04 DIAGNOSIS — M5126 Other intervertebral disc displacement, lumbar region: Secondary | ICD-10-CM | POA: Insufficient documentation

## 2017-03-04 DIAGNOSIS — M48061 Spinal stenosis, lumbar region without neurogenic claudication: Secondary | ICD-10-CM | POA: Diagnosis not present

## 2017-07-02 ENCOUNTER — Emergency Department
Admission: EM | Admit: 2017-07-02 | Discharge: 2017-07-02 | Disposition: A | Discharge status: Home / Self Care | Payer: Medicaid Other | Attending: Emergency Medicine | Admitting: Emergency Medicine

## 2017-07-02 ENCOUNTER — Other Ambulatory Visit: Payer: Self-pay

## 2017-07-02 DIAGNOSIS — F17210 Nicotine dependence, cigarettes, uncomplicated: Secondary | ICD-10-CM | POA: Insufficient documentation

## 2017-07-02 DIAGNOSIS — R222 Localized swelling, mass and lump, trunk: Secondary | ICD-10-CM | POA: Diagnosis present

## 2017-07-02 DIAGNOSIS — J45909 Unspecified asthma, uncomplicated: Secondary | ICD-10-CM | POA: Insufficient documentation

## 2017-07-02 DIAGNOSIS — L03317 Cellulitis of buttock: Secondary | ICD-10-CM | POA: Insufficient documentation

## 2017-07-02 MED ORDER — CLINDAMYCIN HCL 150 MG PO CAPS
300.0000 mg | ORAL_CAPSULE | Freq: Once | ORAL | Status: AC
  Administered 2017-07-02: 300 mg via ORAL
  Filled 2017-07-02: qty 2

## 2017-07-02 MED ORDER — CLINDAMYCIN HCL 300 MG PO CAPS: 300.0000 mg | ORAL_CAPSULE | Freq: Three times a day (TID) | ORAL | 0 refills | Status: AC

## 2017-07-02 NOTE — ED Provider Notes (Signed)
[facility] Emergency Department Provider Note  ____________________________________________  Time seen: Approximately 4:04 PM  I have reviewed the triage vital signs and the nursing notes.   HISTORY  Chief Complaint Abscess    HPI Madison Jenkins is a 22 y.o. female presents to the emergency department with right posterior thigh cellulitis that has been apparent for the past 2 days.  Patient denies a history of cutaneous abscesses and has not had fever or chills.  She denies known instances of skin compromise.  She denies knowledge of insect bites.  No other contacts within the home with similar symptoms.  No alleviating measures have been attempted.   Past Medical History:  Diagnosis Date  . ADD (attention deficit disorder)    ADHD as per pt  . Asthma   . Depression   . Eczema   . Fatty liver   . Headache   . High cholesterol   . Murmur   . Orthostatic hypotension     Patient Active Problem List   Diagnosis Date Noted  . Back pain 02/23/2015    Past Surgical History:  Procedure Laterality Date  . TONSILLECTOMY AND ADENOIDECTOMY      Prior to Admission medications   Medication Sig Start Date End Date Taking? Authorizing Provider  albuterol (PROVENTIL HFA;VENTOLIN HFA) 108 (90 Base) MCG/ACT inhaler Inhale into the lungs every 6 (six) hours as needed for wheezing or shortness of breath.    [provider]  clindamycin (CLEOCIN) 300 MG capsule Take 1 capsule (300 mg total) by mouth 3 (three) times daily for 10 days. 07/02/17 07/12/17  Orvil Feil, PA-C    Allergies Penicillins; Sulfa antibiotics; and Amoxicillin  Family History  Problem Relation Age of Onset  . Breast cancer Paternal Grandmother   . Cancer - Other Mother        ovarian  . Thyroid disease Mother   . Lupus Mother   . Graves' disease Mother     Social History Social History   Tobacco Use  . Smoking status: Current Every Day Smoker    Packs/day: 0.50   Years: 0.50    Pack years: 0.25    Last attempt to quit: 05/06/2015    Years since quitting: 2.1  . Smokeless tobacco: Current User  Substance Use Topics  . Alcohol use: Yes    Comment: occas  . Drug use: No     Review of Systems  Constitutional: No fever/chills Eyes: No visual changes. No discharge ENT: No upper respiratory complaints. Cardiovascular: no chest pain. Respiratory: no cough. No SOB. Gastrointestinal: No abdominal pain.  No nausea, no vomiting.  No diarrhea.  No constipation. Genitourinary: Negative for dysuria. No hematuria Musculoskeletal: Negative for musculoskeletal pain. Skin: Patient has right posterior thigh cellulitis.  Neurological: Negative for headaches, focal weakness or numbness.  ____________________________________________   PHYSICAL EXAM:  VITAL SIGNS: ED Triage Vitals  Enc Vitals Group     BP 07/02/17 1459 131/79     Pulse Rate 07/02/17 1459 (!) 102     Resp 07/02/17 1459 17     Temp 07/02/17 1459 98.5 F (36.9 C)     Temp Source 07/02/17 1459 Oral     SpO2 07/02/17 1459 99 %     Weight 07/02/17 1500 180 lb (81.6 kg)     Height 07/02/17 1500 5\' 9"  (1.753 m)     Head Circumference --      Peak Flow --      Pain Score 07/02/17  1459 8     Pain Loc --      Pain Edu? --      Excl. in GC? --      Constitutional: Alert and oriented. Well appearing and in no acute distress. Eyes: Conjunctivae are normal. PERRL. EOMI. Head: Atraumatic. Cardiovascular: Normal rate, regular rhythm. Normal S1 and S2.  Good peripheral circulation. Respiratory: Normal respiratory effort without tachypnea or retractions. Lungs CTAB. Good air entry to the bases with no decreased or absent breath sounds. Gastrointestinal: Bowel sounds 4 quadrants. Soft and nontender to palpation. No guarding or rigidity. No palpable masses. No distention. No CVA tenderness. Musculoskeletal: Full range of motion to all extremities. No gross deformities appreciated. Neurologic:   Normal speech and language. No gross focal neurologic deficits are appreciated.  Skin: Patient has 2-1/2 cm of circumferential cellulitis along the right posterior thigh without fluctuance.  Mild induration appreciated. Psychiatric: Mood and affect are normal. Speech and behavior are normal. Patient exhibits appropriate insight and judgement.   ____________________________________________   LABS (all labs ordered are listed, but only abnormal results are displayed)  Labs Reviewed - No data to display ____________________________________________  EKG   ____________________________________________  RADIOLOGY   No results found.  ____________________________________________    PROCEDURES  Procedure(s) performed:    Procedures    Medications  clindamycin (CLEOCIN) capsule 300 mg (300 mg Oral Given 07/02/17 1537)     ____________________________________________   INITIAL IMPRESSION / ASSESSMENT AND PLAN / ED COURSE  Pertinent labs & imaging results that were available during my care of the patient were reviewed by me and considered in my medical decision making (see chart for details).  Review of the [place] CSRS was performed in accordance of the [facility] prior to dispensing any controlled drugs.      Assessment and Plan:  Cellulitis Patient presents to the emergency department with a region of right posterior thigh cellulitis that has been present for the past 2 days.  I do not feel that incision and drainage is warranted at this time due to a lack of fluctuance.  Patient was treated empirically with clindamycin and advised to return to the emergency department in two days if symptoms worsen.  Patient was cautioned that incision and drainage could possibly be warranted in the near future. Patient voiced understanding.  Vital signs are reassuring prior to discharge.  All patient questions were answered.    ____________________________________________  FINAL CLINICAL  IMPRESSION(S) / ED DIAGNOSES  Final diagnoses:  Cellulitis of buttock      NEW MEDICATIONS STARTED DURING THIS VISIT:  ED Discharge Orders        Ordered    clindamycin (CLEOCIN) 300 MG capsule  3 times daily     07/02/17 1528          This chart was dictated using voice recognition software/Dragon. Despite best efforts to proofread, errors can occur which can change the meaning. Any change was purely unintentional.    Orvil FeilWoods, Mylissa Lambe M, PA-C 07/02/17 1626    Don PerkingVeronese, [place], MD 07/03/17 1540

## 2017-07-02 NOTE — ED Triage Notes (Signed)
Pt c/o abscess to the right buttock since yesterday.

## 2017-07-23 ENCOUNTER — Other Ambulatory Visit: Payer: Self-pay

## 2017-07-23 ENCOUNTER — Emergency Department
Admission: EM | Admit: 2017-07-23 | Discharge: 2017-07-23 | Disposition: A | Discharge status: Home / Self Care | Payer: Medicaid Other | Attending: Emergency Medicine | Admitting: Emergency Medicine

## 2017-07-23 ENCOUNTER — Encounter: Payer: Self-pay | Admitting: Emergency Medicine

## 2017-07-23 DIAGNOSIS — L03115 Cellulitis of right lower limb: Secondary | ICD-10-CM | POA: Diagnosis not present

## 2017-07-23 DIAGNOSIS — W57XXXA Bitten or stung by nonvenomous insect and other nonvenomous arthropods, initial encounter: Secondary | ICD-10-CM | POA: Insufficient documentation

## 2017-07-23 DIAGNOSIS — F17200 Nicotine dependence, unspecified, uncomplicated: Secondary | ICD-10-CM | POA: Diagnosis not present

## 2017-07-23 DIAGNOSIS — Y929 Unspecified place or not applicable: Secondary | ICD-10-CM | POA: Insufficient documentation

## 2017-07-23 DIAGNOSIS — J45909 Unspecified asthma, uncomplicated: Secondary | ICD-10-CM | POA: Diagnosis not present

## 2017-07-23 DIAGNOSIS — Y939 Activity, unspecified: Secondary | ICD-10-CM | POA: Insufficient documentation

## 2017-07-23 DIAGNOSIS — Y999 Unspecified external cause status: Secondary | ICD-10-CM | POA: Diagnosis not present

## 2017-07-23 DIAGNOSIS — S80861A Insect bite (nonvenomous), right lower leg, initial encounter: Secondary | ICD-10-CM | POA: Insufficient documentation

## 2017-07-23 NOTE — ED Notes (Signed)
Pt noticed some redness on her right leg on 7/13. Pt states she noticed swelling on R/leg on 7/17. Pt went to see her PCP 7/17; PCP prescribed her antibiotics and ointment. No apparent distress noted. Pt A/O x4

## 2017-07-23 NOTE — ED Triage Notes (Signed)
Pt comes into the ED via POV c/o insect bite to the outside of her right calf.  Patient states she believes it is a spider bite but she is unsure.  Patient states she notices it on Monday.  There is redness around the site but no streaking redness.  Patient has already been given doxycycline and mupirocin ointment on Wednesday.  Patient states the pain has gotten worse today.

## 2017-07-23 NOTE — ED Provider Notes (Signed)
[facility] EMERGENCY DEPARTMENT Provider Note   CSN: [id number] Arrival date & time: 07/23/17  2120     History   Chief Complaint Chief Complaint  Patient presents with  . Insect Bite    HPI Madison Jenkins is a 22 y.o. female.  Presents to the emergency department for evaluation of right leg swelling, redness.  Patient states 6 days ago she noticed a bug bite to her right lateral lower leg.  She is uncertain what bit her but she developed some redness and swelling.  She went to PCP 2 days ago and was started on mupirocin ointment and oral doxycycline.  She has seen improvement in the redness but comes in today for recheck.  Patient states pain swelling and redness have both improved.  She still has 8 days of doxycycline along with another tube of mupirocin ointment remaining.  She denies any fevers, rashes, headaches, joint aches.  No drainage.  HPI  Past Medical History:  Diagnosis Date  . ADD (attention deficit disorder)    ADHD as per pt  . Asthma   . Depression   . Eczema   . Fatty liver   . Headache   . High cholesterol   . Murmur   . Orthostatic hypotension     Patient Active Problem List   Diagnosis Date Noted  . Back pain 02/23/2015    Past Surgical History:  Procedure Laterality Date  . TONSILLECTOMY AND ADENOIDECTOMY       OB History    Gravida  2   Para  1   Term  1   Preterm      AB  1   Living  1     SAB  1   TAB      Ectopic      Multiple  0   Live Births  1            Home Medications    Prior to Admission medications   Medication Sig Start Date End Date Taking? Authorizing Provider  albuterol (PROVENTIL HFA;VENTOLIN HFA) 108 (90 Base) MCG/ACT inhaler Inhale into the lungs every 6 (six) hours as needed for wheezing or shortness of breath.    [provider]    Family History Family History  Problem Relation Age of Onset  . Breast cancer Paternal Grandmother   . Cancer - Other Mother          ovarian  . Thyroid disease Mother   . Lupus Mother   . Graves' disease Mother     Social History Social History   Tobacco Use  . Smoking status: Current Every Day Smoker    Packs/day: 0.50    Years: 0.50    Pack years: 0.25    Last attempt to quit: 05/06/2015    Years since quitting: 2.2  . Smokeless tobacco: Current User  Substance Use Topics  . Alcohol use: Yes    Comment: occas  . Drug use: No     Allergies   Penicillins; Sulfa antibiotics; and Amoxicillin   Review of Systems Review of Systems  Constitutional: Negative for fever.  Respiratory: Negative for shortness of breath.   Cardiovascular: Negative for chest pain.  Gastrointestinal: Negative for abdominal pain.  Genitourinary: Negative for difficulty urinating, dysuria and urgency.  Musculoskeletal: Negative for back pain and myalgias.  Skin: Positive for wound. Negative for rash.  Neurological: Negative for dizziness and headaches.     Physical Exam Updated Vital Signs BP  139/75 (BP Location: Left Arm)   Pulse 98   Temp 98.2 F (36.8 C) (Oral)   Resp 16   Ht 5\' 8"  (1.727 m)   Wt 81.6 kg (180 lb)   LMP 06/24/2017 (Exact Date)   SpO2 99%   BMI 27.37 kg/m   Physical Exam  Constitutional: She is oriented to person, place, and time. She appears well-developed and well-nourished.  HENT:  Head: Normocephalic and atraumatic.  Eyes: Conjunctivae are normal.  Neck: Normal range of motion.  Cardiovascular: Normal rate.  Pulmonary/Chest: Effort normal. No respiratory distress.  Musculoskeletal: Normal range of motion.  Right lateral lower leg shows a 3 x 3 cm area of erythema with well-demarcated lines with a centralized area of ulceration that is shallow 1 to 2 mm that is approximately 1 cm in diameter.  There is no induration, fluctuance or drainage.  Calf is soft and nontender with no edema.  Neurological: She is alert and oriented to person, place, and time.  Skin: Skin is warm. No rash noted.   Psychiatric: She has a normal mood and affect. Her behavior is normal. Thought content normal.     ED Treatments / Results  Labs (all labs ordered are listed, but only abnormal results are displayed) Labs Reviewed - No data to display  EKG None  Radiology No results found.  Procedures Procedures (including critical care time)  Medications Ordered in ED Medications - No data to display   Initial Impression / Assessment and Plan / ED Course  I have reviewed the triage vital signs and the nursing notes.  Pertinent labs & imaging results that were available during my care of the patient were reviewed by me and considered in my medical decision making (see chart for details).     22 year old female with insect bite to the right lateral lower leg with mild cellulitis.  She will continue with doxycycline and mupirocin ointment.  She is educated on signs and symptoms return to clinic for.  Area of skin erythema was marked with a skin pen.  Final Clinical Impressions(s) / ED Diagnoses   Final diagnoses:  Insect bite of right lower leg, initial encounter  Cellulitis of right lower extremity    ED Discharge Orders    None       Ronnette JuniperGaines, Thomas C, PA-C 07/23/17 2205    Minna AntisPaduchowski, Kevin, MD 07/23/17 [phone]

## 2017-07-23 NOTE — Discharge Instructions (Addendum)
Please continue with oral and topical antibiotics.  Monitor area of erythema and if exceeding skin pen mark return to the ED.  Also return to the ED if any increasing pain or fevers.

## 2017-08-19 ENCOUNTER — Encounter: Payer: Medicaid Other | Admitting: Certified Nurse Midwife

## 2017-09-08 ENCOUNTER — Encounter: Payer: Self-pay | Admitting: Certified Nurse Midwife

## 2017-09-09 ENCOUNTER — Encounter: Payer: Self-pay | Admitting: Certified Nurse Midwife

## 2017-10-21 ENCOUNTER — Encounter: Payer: Medicaid Other | Admitting: Certified Nurse Midwife
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
## 2018-01-05 NOTE — L&D Delivery Note (Signed)
Delivery Note  Date of delivery: 08/27/2018 Estimated Date of Delivery: 09/15/18 Patient's last menstrual period was 12/09/2017. EGA: [redacted]w[redacted]d  First Stage: Induction/Augmentation : misoprostol, pitocin, AROM Analgesia Lorriane Shire intrapartum: Stadol, epidrual AROM at 0614  Joanie Coddington presented to L&D with gestational hypertension. She was induced with misoprostol and pitocin. She was augmented with AROM. Epidural placed for pain relief.   Second Stage: Complete dilation at 1145 Onset of pushing at 1145 FHR second stage: category II Delivery at 1235 on 08/27/2018  She progressed to complete and had a spontaneous vaginal birth of a live female over an intact perineum. The fetal head was delivered in LOT position with restitution to LOP. No nuchal cord. Anterior then posterior shoulders delivered spontaneously. Baby placed on mom's abdomen and attended to by transition RN. Cord clamped and cut after 5 minute delay by father of the baby.  Third Stage: Placenta delivered spontaneously intact with 3VC at 1243 Placenta disposition: pathology Uterine tone boggy / bleeding small, though large with fundal massage IV pitocin given for hemorrhage prophylaxis along with rectal misoprostol 868mcg  no laceration identified  Anesthesia for repair: n/a Repair: n/a Est. Blood Loss (mL): 956  Complications: none  Mom to postpartum.  Baby to Couplet care / Skin to Skin.  Newborn: Birth Weight: pending  Apgar Scores: 8, 9 Feeding planned: formula   [name], CNM 08/27/2018 1:09 PM

## 2018-02-11 ENCOUNTER — Emergency Department
Admission: EM | Admit: 2018-02-11 | Discharge: 2018-02-11 | Disposition: A | Discharge status: Home / Self Care | Payer: Medicaid Other | Attending: Emergency Medicine | Admitting: Emergency Medicine

## 2018-02-11 ENCOUNTER — Telehealth: Payer: Self-pay

## 2018-02-11 ENCOUNTER — Encounter: Payer: Self-pay | Admitting: Emergency Medicine

## 2018-02-11 ENCOUNTER — Emergency Department: Payer: Medicaid Other

## 2018-02-11 DIAGNOSIS — N939 Abnormal uterine and vaginal bleeding, unspecified: Secondary | ICD-10-CM | POA: Diagnosis not present

## 2018-02-11 DIAGNOSIS — Z5321 Procedure and treatment not carried out due to patient leaving prior to being seen by health care provider: Secondary | ICD-10-CM | POA: Insufficient documentation

## 2018-02-11 LAB — CBC
HEMATOCRIT: 37 % (ref 36.0–46.0)
HEMOGLOBIN: 12.6 g/dL (ref 12.0–15.0)
MCH: 29.2 pg (ref 26.0–34.0)
MCHC: 34.1 g/dL (ref 30.0–36.0)
MCV: 85.8 fL (ref 80.0–100.0)
Platelets: 267 10*3/uL (ref 150–400)
RBC: 4.31 MIL/uL (ref 3.87–5.11)
RDW: 12.6 % (ref 11.5–15.5)
WBC: 11.5 10*3/uL — AB (ref 4.0–10.5)
nRBC: 0 % (ref 0.0–0.2)

## 2018-02-11 LAB — ABO/RH: ABO/RH(D): A POS

## 2018-02-11 LAB — HCG, QUANTITATIVE, PREGNANCY: HCG, BETA CHAIN, QUANT, S: 163252 m[IU]/mL — AB (ref <5)

## 2018-02-11 NOTE — ED Triage Notes (Signed)
Pt is [redacted] weeks pregnant with vaginal bleeding that has occurred once last week and today. Pt describes as dark red with no clots today but with clots last week. Pt seen at PCP and had blood work on Tuesday with confirmed pregnancy. Pt due for OBGYN appointment on Tuesday 2/11.

## 2018-02-11 NOTE — Telephone Encounter (Signed)
Patient called office with c/o vaginal spotting that lasted 3 hours yesterday, IC 2 days ago, c/o pelvic and lower back pain yesterday.  Patient evaluated and d/c from the ER 2/6.  Advised patient to get rest, hydrate and take Tylenol for pain.  Patient aware she is to call back or go to ER if bleeding returns and is >1 PPH, if she develops a fever or if she has severe pain.  Patient verbalized understanding.

## 2018-02-11 NOTE — ED Notes (Signed)
Pt noted leaving ED lobby with SO 

## 2018-02-11 NOTE — ED Notes (Signed)
Pt has not returned to lobby 

## 2018-02-12 LAB — OB RESULTS CONSOLE GBS: GBS: NEGATIVE

## 2018-02-14 ENCOUNTER — Telehealth: Payer: Self-pay

## 2018-02-14 NOTE — Telephone Encounter (Signed)
I spoke with [facility] on Friday 2/7, she went to the ER due to vaginal spotting 2 days after intercourse.  She said the bleeding lasted 3 hours and then she went home.  I advised her on reasons to call our office or go back to ER and she verbalized understanding.  Patient never told me she walked out of the ER.  She is scheduled for a NOB intake tomorrow.  No further action is needed.

## 2018-02-14 NOTE — Telephone Encounter (Signed)
[facility] called and stated that pt was at the hospital but was never seen she left before being seen. Please advise.

## 2018-02-15 ENCOUNTER — Ambulatory Visit (INDEPENDENT_AMBULATORY_CARE_PROVIDER_SITE_OTHER): Payer: Medicaid Other | Admitting: Certified Nurse Midwife

## 2018-02-15 ENCOUNTER — Encounter: Payer: Medicaid Other | Admitting: Certified Nurse Midwife

## 2018-02-15 VITALS — BP 121/85 | HR 94 | Ht 69.0 in | Wt 179.5 lb

## 2018-02-15 DIAGNOSIS — T148XXA Other injury of unspecified body region, initial encounter: Secondary | ICD-10-CM

## 2018-02-15 DIAGNOSIS — Z113 Encounter for screening for infections with a predominantly sexual mode of transmission: Secondary | ICD-10-CM

## 2018-02-15 DIAGNOSIS — Z0283 Encounter for blood-alcohol and blood-drug test: Secondary | ICD-10-CM

## 2018-02-15 DIAGNOSIS — Z3481 Encounter for supervision of other normal pregnancy, first trimester: Secondary | ICD-10-CM

## 2018-02-15 LAB — OB RESULTS CONSOLE VARICELLA ZOSTER ANTIBODY, IGG: Varicella: IMMUNE

## 2018-02-15 NOTE — Progress Notes (Signed)
      Marcy Salvo presents for NOB nurse intake visit. Pregnancy confirmation done at [facility], 02/07/18, with [facility].  G 3.  [id number].  LMP 12/09/17.  EDD 09/15/18.  Ga [redacted]w[redacted]d. Pregnancy education material explained and given.  0 cats in the home.  NOB labs ordered. BMI less than 30. TSH/HbgA1c not ordered.  HIV and drug screen explained and ordered. Genetic screening discussed. Genetic testing; Unsure. Pt to discuss genetic testing with provider. PNV encouraged. Pt to follow up in 1 week for ultrasound. Pt to follow up with provider in 2 weeks for NOB physical.   BP 121/85   Pulse 94   Ht 5\' 9"  (1.753 m)   Wt 179 lb 8 oz (81.4 kg)   LMP 12/09/2017   BMI 26.51 kg/m

## 2018-02-15 NOTE — Patient Instructions (Signed)
WHAT OB PATIENTS CAN EXPECT   Confirmation of pregnancy and ultrasound ordered if medically indicated-[redacted] weeks gestation  New OB (NOB) intake with nurse and New OB (NOB) labs- [redacted] weeks gestation  New OB (NOB) physical examination with provider- 11/[redacted] weeks gestation  Flu vaccine-[redacted] weeks gestation  Anatomy scan-[redacted] weeks gestation  Glucose tolerance test, blood work to test for anemia, T-dap vaccine-[redacted] weeks gestation  Vaginal swabs/cultures-STD/Group B strep-[redacted] weeks gestation  Appointments every 4 weeks until 28 weeks  Every 2 weeks from 28 weeks until 36 weeks  Weekly visits from 36 weeks until delivery  Morning Sickness  Morning sickness is when you feel sick to your stomach (nauseous) during pregnancy. You may feel sick to your stomach and throw up (vomit). You may feel sick in the morning, but you can feel this way at any time of day. Some women feel very sick to their stomach and cannot stop throwing up (hyperemesis gravidarum). Follow these instructions at home: Medicines  Take over-the-counter and prescription medicines only as told by your doctor. Do not take any medicines until you talk with your doctor about them first.  Taking multivitamins before getting pregnant can stop or lessen the harshness of morning sickness. Eating and drinking  Eat dry toast or crackers before getting out of bed.  Eat 5 or 6 small meals a day.  Eat dry and bland foods like rice and baked potatoes.  Do not eat greasy, fatty, or spicy foods.  Have someone cook for you if the smell of food causes you to feel sick or throw up.  If you feel sick to your stomach after taking prenatal vitamins, take them at night or with a snack.  Eat protein when you need a snack. Nuts, yogurt, and cheese are good choices.  Drink fluids throughout the day.  Try ginger ale made with real ginger, ginger tea made from fresh grated ginger, or ginger candies. General instructions  Do not use any products  that have nicotine or tobacco in them, such as cigarettes and e-cigarettes. If you need help quitting, ask your doctor.  Use an air purifier to keep the air in your house free of smells.  Get lots of fresh air.  Try to avoid smells that make you feel sick.  Try: ? Wearing a bracelet that is used for seasickness (acupressure wristband). ? Going to a doctor who puts thin needles into certain body points (acupuncture) to improve how you feel. Contact a doctor if:  You need medicine to feel better.  You feel dizzy or light-headed.  You are losing weight. Get help right away if:  You feel very sick to your stomach and cannot stop throwing up.  You pass out (faint).  You have very bad pain in your belly. Summary  Morning sickness is when you feel sick to your stomach (nauseous) during pregnancy.  You may feel sick in the morning, but you can feel this way at any time of day.  Making some changes to what you eat may help your symptoms go away. This information is not intended to replace advice given to you by your health care provider. Make sure you discuss any questions you have with your health care provider. Document Released: 01/30/2004 Document Revised: 01/23/2016 Document Reviewed: 01/23/2016 Elsevier Interactive Patient Education  2019 [employer]. How a Baby Grows During Pregnancy  Pregnancy begins when a female's sperm enters a female's egg (fertilization). Fertilization usually happens in one of the tubes (fallopian tubes) that connect  the ovaries to the womb (uterus). The fertilized egg moves down the fallopian tube to the uterus. Once it reaches the uterus, it implants into the lining of the uterus and begins to grow. For the first 10 weeks, the fertilized egg is called an embryo. After 10 weeks, it is called a fetus. As the fetus continues to grow, it receives oxygen and nutrients through tissue (placenta) that grows to support the developing baby. The placenta is the life  support system for the baby. It provides oxygen and nutrition and removes waste. Learning as much as you can about your pregnancy and how your baby is developing can help you enjoy the experience. It can also make you aware of when there might be a problem and when to ask questions. How long does a typical pregnancy last? A pregnancy usually lasts 280 days, or about 40 weeks. Pregnancy is divided into three periods of growth, also called trimesters:  First trimester: 0-12 weeks.  Second trimester: 13-27 weeks.  Third trimester: 28-40 weeks. The day when your baby is ready to be born (full term) is your estimated date of delivery. How does my baby develop month by month? First month  The fertilized egg attaches to the inside of the uterus.  Some cells will form the placenta. Others will form the fetus.  The arms, legs, brain, spinal cord, lungs, and heart begin to develop.  At the end of the first month, the heart begins to beat. Second month  The bones, inner ear, eyelids, hands, and feet form.  The genitals develop.  By the end of 8 weeks, all major organs are developing. Third month  All of the internal organs are forming.  Teeth develop below the gums.  Bones and muscles begin to grow. The spine can flex.  The skin is transparent.  Fingernails and toenails begin to form.  Arms and legs continue to grow longer, and hands and feet develop.  The fetus is about 3 inches (7.6 cm) long. Fourth month  The placenta is completely formed.  The external sex organs, neck, outer ear, eyebrows, eyelids, and fingernails are formed.  The fetus can hear, swallow, and move its arms and legs.  The kidneys begin to produce urine.  The skin is covered with a white, waxy coating (vernix) and very fine hair (lanugo). Fifth month  The fetus moves around more and can be felt for the first time (quickening).  The fetus starts to sleep and wake up and may begin to suck its  finger.  The nails grow to the end of the fingers.  The organ in the digestive system that makes bile (gallbladder) functions and helps to digest nutrients.  If your baby is a girl, eggs are present in her ovaries. If your baby is a boy, testicles start to move down into his scrotum. Sixth month  The lungs are formed.  The eyes open. The brain continues to develop.  Your baby has fingerprints and toe prints. Your baby's hair grows thicker.  At the end of the second trimester, the fetus is about 9 inches (22.9 cm) long. Seventh month  The fetus kicks and stretches.  The eyes are developed enough to sense changes in light.  The hands can make a grasping motion.  The fetus responds to sound. Eighth month  All organs and body systems are fully developed and functioning.  Bones harden, and taste buds develop. The fetus may hiccup.  Certain areas of the brain are still developing.  The skull remains soft. Ninth month  The fetus gains about  lb (0.23 kg) each week.  The lungs are fully developed.  Patterns of sleep develop.  The fetus's head typically moves into a head-down position (vertex) in the uterus to prepare for birth.  The fetus weighs 6-9 lb (2.72-4.08 kg) and is 19-20 inches (48.26-50.8 cm) long. What can I do to have a healthy pregnancy and help my baby develop? General instructions  Take prenatal vitamins as directed by your health care provider. These include vitamins such as folic acid, iron, calcium, and vitamin D. They are important for healthy development.  Take medicines only as directed by your health care provider. Read labels and ask a pharmacist or your health care provider whether over-the-counter medicines, supplements, and prescription drugs are safe to take during pregnancy.  Keep all follow-up visits as directed by your health care provider. This is important. Follow-up visits include prenatal care and screening tests. How do I know if my baby  is developing well? At each prenatal visit, your health care provider will do several different tests to check on your health and keep track of your baby's development. These include:  Fundal height and position. ? Your health care provider will measure your growing belly from your pubic bone to the top of the uterus using a tape measure. ? Your health care provider will also feel your belly to determine your baby's position.  Heartbeat. ? An ultrasound in the first trimester can confirm pregnancy and show a heartbeat, depending on how far along you are. ? Your health care provider will check your baby's heart rate at every prenatal visit.  Second trimester ultrasound. ? This ultrasound checks your baby's development. It also may show your baby's gender. What should I do if I have concerns about my baby's development? Always talk with your health care provider about any concerns that you may have about your pregnancy and your baby. Summary  A pregnancy usually lasts 280 days, or about 40 weeks. Pregnancy is divided into three periods of growth, also called trimesters.  Your health care provider will monitor your baby's growth and development throughout your pregnancy.  Follow your health care provider's recommendations about taking prenatal vitamins and medicines during your pregnancy.  Talk with your health care provider if you have any concerns about your pregnancy or your developing baby. This information is not intended to replace advice given to you by your health care provider. Make sure you discuss any questions you have with your health care provider. Document Released: 06/10/2007 Document Revised: 11/04/2016 Document Reviewed: 11/04/2016 Elsevier Interactive Patient Education  2019 [employer]  The first trimester of pregnancy is from week 1 until the end of week 13 (months 1 through 3). During this time, your baby will begin to develop inside  you. At 6-8 weeks, the eyes and face are formed, and the heartbeat can be seen on ultrasound. At the end of 12 weeks, all the baby's organs are formed. Prenatal care is all the medical care you receive before the birth of your baby. Make sure you get good prenatal care and follow all of your doctor's instructions. Follow these instructions at home: Medicines  Take over-the-counter and prescription medicines only as told by your doctor. Some medicines are safe and some medicines are not safe during pregnancy.  Take a prenatal vitamin that contains at least 600 micrograms (mcg) of folic acid.  If you have trouble pooping (constipation), take  medicine that will make your stool soft (stool softener) if your doctor approves. Eating and drinking   Eat regular, healthy meals.  Your doctor will tell you the amount of weight gain that is right for you.  Avoid raw meat and uncooked cheese.  If you feel sick to your stomach (nauseous) or throw up (vomit): ? Eat 4 or 5 small meals a day instead of 3 large meals. ? Try eating a few soda crackers. ? Drink liquids between meals instead of during meals.  To prevent constipation: ? Eat foods that are high in fiber, like fresh fruits and vegetables, whole grains, and beans. ? Drink enough fluids to keep your pee (urine) clear or pale yellow. Activity  Exercise only as told by your doctor. Stop exercising if you have cramps or pain in your lower belly (abdomen) or low back.  Do not exercise if it is too hot, too humid, or if you are in a place of great height (high altitude).  Try to avoid standing for long periods of time. Move your legs often if you must stand in one place for a long time.  Avoid heavy lifting.  Wear low-heeled shoes. Sit and stand up straight.  You can have sex unless your doctor tells you not to. Relieving pain and discomfort  Wear a good support bra if your breasts are sore.  Take warm water baths (sitz baths) to soothe  pain or discomfort caused by hemorrhoids. Use hemorrhoid cream if your doctor says it is okay.  Rest with your legs raised if you have leg cramps or low back pain.  If you have puffy, bulging veins (varicose veins) in your legs: ? Wear support hose or compression stockings as told by your doctor. ? Raise (elevate) your feet for 15 minutes, 3-4 times a day. ? Limit salt in your food. Prenatal care  Schedule your prenatal visits by the twelfth week of pregnancy.  Write down your questions. Take them to your prenatal visits.  Keep all your prenatal visits as told by your doctor. This is important. Safety  Wear your seat belt at all times when driving.  Make a list of emergency phone numbers. The list should include numbers for family, friends, the hospital, and police and fire departments. General instructions  Ask your doctor for a referral to a local prenatal class. Begin classes no later than at the start of month 6 of your pregnancy.  Ask for help if you need counseling or if you need help with nutrition. Your doctor can give you advice or tell you where to go for help.  Do not use hot tubs, steam rooms, or saunas.  Do not douche or use tampons or scented sanitary pads.  Do not cross your legs for long periods of time.  Avoid all herbs and alcohol. Avoid drugs that are not approved by your doctor.  Do not use any tobacco products, including cigarettes, chewing tobacco, and electronic cigarettes. If you need help quitting, ask your doctor. You may get counseling or other support to help you quit.  Avoid cat litter boxes and soil used by cats. These carry germs that can cause birth defects in the baby and can cause a loss of your baby (miscarriage) or stillbirth.  Visit your dentist. At home, brush your teeth with a soft toothbrush. Be gentle when you floss. Contact a doctor if:  You are dizzy.  You have mild cramps or pressure in your lower belly.  You have a  nagging pain  in your belly area.  You continue to feel sick to your stomach, you throw up, or you have watery poop (diarrhea).  You have a bad smelling fluid coming from your vagina.  You have pain when you pee (urinate).  You have increased puffiness (swelling) in your face, hands, legs, or ankles. Get help right away if:  You have a fever.  You are leaking fluid from your vagina.  You have spotting or bleeding from your vagina.  You have very bad belly cramping or pain.  You gain or lose weight rapidly.  You throw up blood. It may look like coffee grounds.  You are around people who have German measles, fifth disease, or chickenpox.  You have a very bad headache.  You have shortness of breath.  You have any kind of trauma, such as from a fall or a car accident. Summary  The first trimester of pregnancy is from week 1 until the end of week 13 (months 1 through 3).  To take care of yourself and your unborn baby, you will need to eat healthy meals, take medicines only if your doctor tells you to do so, and do activities that are safe for you and your baby.  Keep all follow-up visits as told by your doctor. This is important as your doctor will have to ensure that your baby is healthy and growing well. This information is not intended to replace advice given to you by your health care provider. Make sure you discuss any questions you have with your health care provider. Document Released: 06/10/2007 Document Revised: 12/31/2015 Document Reviewed: 12/31/2015 Elsevier Interactive Patient Education  2019 [employer] Commonly Asked Questions During Pregnancy  Cats: A parasite can be excreted in cat feces.  To avoid exposure you need to have another person empty the little box.  If you must empty the litter box you will need to wear gloves.  Wash your hands after handling your cat.  This parasite can also be found in raw or undercooked meat so this should also be avoided.  Colds, Sore  Throats, Flu: Please check your medication sheet to see what you can take for symptoms.  If your symptoms are unrelieved by these medications please call the office.  Dental Work: Most any dental work your dentist recommends is permitted.  X-rays should only be taken during the first trimester if absolutely necessary.  Your abdomen should be shielded with a lead apron during all x-rays.  Please notify your provider prior to receiving any x-rays.  Novocaine is fine; gas is not recommended.  If your dentist requires a note from us prior to dental work please call the office and we will provide one for you.  Exercise: Exercise is an important part of staying healthy during your pregnancy.  You may continue most exercises you were accustomed to prior to pregnancy.  Later in your pregnancy you will most likely notice you have difficulty with activities requiring balance like riding a bicycle.  It is important that you listen to your body and avoid activities that put you at a higher risk of falling.  Adequate rest and staying well hydrated are a must!  If you have questions about the safety of specific activities ask your provider.    Exposure to Children with illness: Try to avoid obvious exposure; report any symptoms to us when noted,  If you have chicken pos, red measles or mumps, you should be immune to these diseases.     Please do not take any vaccines while pregnant unless you have checked with your OB provider.  Fetal Movement: After 28 weeks we recommend you do "kick counts" twice daily.  Lie or sit down in a calm quiet environment and count your baby movements "kicks".  You should feel your baby at least 10 times per hour.  If you have not felt 10 kicks within the first hour get up, walk around and have something sweet to eat or drink then repeat for an additional hour.  If count remains less than 10 per hour notify your provider.  Fumigating: Follow your pest control agent's advice as to how long to  stay out of your home.  Ventilate the area well before re-entering.  Hemorrhoids:   Most over-the-counter preparations can be used during pregnancy.  Check your medication to see what is safe to use.  It is important to use a stool softener or fiber in your diet and to drink lots of liquids.  If hemorrhoids seem to be getting worse please call the office.   Hot Tubs:  Hot tubs Jacuzzis and saunas are not recommended while pregnant.  These increase your internal body temperature and should be avoided.  Intercourse:  Sexual intercourse is safe during pregnancy as long as you are comfortable, unless otherwise advised by your provider.  Spotting may occur after intercourse; report any bright red bleeding that is heavier than spotting.  Labor:  If you know that you are in labor, please go to the hospital.  If you are unsure, please call the office and let us help you decide what to do.  Lifting, straining, etc:  If your job requires heavy lifting or straining please check with your provider for any limitations.  Generally, you should not lift items heavier than that you can lift simply with your hands and arms (no back muscles)  Painting:  Paint fumes do not harm your pregnancy, but may make you ill and should be avoided if possible.  Latex or water based paints have less odor than oils.  Use adequate ventilation while painting.  Permanents & Hair Color:  Chemicals in hair dyes are not recommended as they cause increase hair dryness which can increase hair loss during pregnancy.  " Highlighting" and permanents are allowed.  Dye may be absorbed differently and permanents may not hold as well during pregnancy.  Sunbathing:  Use a sunscreen, as skin burns easily during pregnancy.  Drink plenty of fluids; avoid over heating.  Tanning Beds:  Because their possible side effects are still unknown, tanning beds are not recommended.  Ultrasound Scans:  Routine ultrasounds are performed at approximately 20  weeks.  You will be able to see your baby's general anatomy an if you would like to know the gender this can usually be determined as well.  If it is questionable when you conceived you may also receive an ultrasound early in your pregnancy for dating purposes.  Otherwise ultrasound exams are not routinely performed unless there is a medical necessity.  Although you can request a scan we ask that you pay for it when conducted because insurance does not cover " patient request" scans.  Work: If your pregnancy proceeds without complications you may work until your due date, unless your physician or employer advises otherwise.  Round Ligament Pain/Pelvic Discomfort:  Sharp, shooting pains not associated with bleeding are fairly common, usually occurring in the second trimester of pregnancy.  They tend to be worse when standing up or  when you remain standing for long periods of time.  These are the result of pressure of certain pelvic ligaments called "round ligaments".  Rest, Tylenol and heat seem to be the most effective relief.  As the womb and fetus grow, they rise out of the pelvis and the discomfort improves.  Please notify the office if your pain seems different than that described.  It may represent a more serious condition.  Common Medications Safe in Pregnancy  Acne:      Constipation:  Benzoyl Peroxide     Colace  Clindamycin      Dulcolax Suppository  Topica Erythromycin     Fibercon  Salicylic Acid      Metamucil         Miralax AVOID:        Senakot   Accutane    Cough:  Retin-A       Cough Drops  Tetracycline      Phenergan w/ Codeine if Rx  Minocycline      Robitussin (Plain & DM)  Antibiotics:     Crabs/Lice:  Ceclor       RID  Cephalosporins    AVOID:  E-Mycins      Kwell  Keflex  Macrobid/Macrodantin   Diarrhea:  Penicillin      Kao-Pectate  Zithromax      Imodium AD         PUSH FLUIDS AVOID:       Cipro     Fever:  Tetracycline      Tylenol (Regular or  Extra  Minocycline       Strength)  Levaquin      Extra Strength-Do not          Exceed 8 tabs/24 hrs Caffeine:        <224m/day (equiv. To 1 cup of coffee or  approx. 3 12 oz sodas)         Gas: Cold/Hayfever:       Gas-X  Benadryl      Mylicon  Claritin       Phazyme  **Claritin-D        Chlor-Trimeton    Headaches:  Dimetapp      ASA-Free Excedrin  Drixoral-Non-Drowsy     Cold Compress  Mucinex (Guaifenasin)     Tylenol (Regular or Extra  Sudafed/Sudafed-12 Hour     Strength)  **Sudafed PE Pseudoephedrine   Tylenol Cold & Sinus     Vicks Vapor Rub  Zyrtec  **AVOID if Problems With Blood Pressure         Heartburn: Avoid lying down for at least 1 hour after meals  Aciphex      Maalox     Rash:  Milk of Magnesia     Benadryl    Mylanta       1% Hydrocortisone Cream  Pepcid  Pepcid Complete   Sleep Aids:  Prevacid      Ambien   Prilosec       Benadryl  Rolaids       Chamomile Tea  Tums (Limit 4/day)     Unisom  Zantac       Tylenol PM         Warm milk-add vanilla or  Hemorrhoids:       Sugar for taste  Anusol/Anusol H.C.  (RX: Analapram 2.5%)  Sugar Substitutes:  Hydrocortisone OTC     Ok in moderation  Preparation H      Tucks  Vaseline lotion applied to tissue with wiping    Herpes:     Throat:  Acyclovir      Oragel  Famvir  Valtrex     Vaccines:         Flu Shot Leg Cramps:       *Gardasil  Benadryl      Hepatitis A         Hepatitis B Nasal Spray:       Pneumovax  Saline Nasal Spray     Polio Booster         Tetanus Nausea:       Tuberculosis test or PPD  Vitamin B6 25 mg TID   AVOID:    Dramamine      *Gardasil  Emetrol       Live Poliovirus  Ginger Root 250 mg QID    MMR (measles, mumps &  High Complex Carbs @ Bedtime    rebella)  Sea Bands-Accupressure    Varicella (Chickenpox)  Unisom 1/2 tab TID     *No known complications           If received before Pain:         Known pregnancy;   Darvocet       Resume series  after  Lortab        Delivery  Percocet    Yeast:   Tramadol      Femstat  Tylenol 3      Gyne-lotrimin  Ultram       Monistat  Vicodin           MISC:         All Sunscreens           Hair Coloring/highlights          Insect Repellant's          (Including DEET)         Mystic Tans

## 2018-02-16 LAB — NICOTINE SCREEN, URINE: Cotinine Ql Scrn, Ur: NEGATIVE ng/mL

## 2018-02-16 LAB — URINALYSIS, ROUTINE W REFLEX MICROSCOPIC
BILIRUBIN UA: NEGATIVE
Glucose, UA: NEGATIVE
Ketones, UA: NEGATIVE
Nitrite, UA: NEGATIVE
PH UA: 6 (ref 5.0–7.5)
RBC UA: NEGATIVE
Specific Gravity, UA: >=1.03 — AB (ref 1.005–1.030)
UUROB: 0.2 mg/dL (ref 0.2–1.0)

## 2018-02-16 LAB — HEPATITIS B SURFACE ANTIGEN: HEP B S AG: NEGATIVE

## 2018-02-16 LAB — DRUG PROFILE, UR, 9 DRUGS (LABCORP)
Amphetamines, Urine: NEGATIVE ng/mL
Barbiturate Quant, Ur: NEGATIVE ng/mL
Benzodiazepine Quant, Ur: NEGATIVE ng/mL
Cannabinoid Quant, Ur: NEGATIVE ng/mL
Cocaine (Metab.): NEGATIVE ng/mL
METHADONE SCREEN, URINE: NEGATIVE ng/mL
OPIATE QUANT UR: NEGATIVE ng/mL
PCP QUANT UR: NEGATIVE ng/mL
PROPOXYPHENE: NEGATIVE ng/mL

## 2018-02-16 LAB — RPR: RPR Ser Ql: NONREACTIVE

## 2018-02-16 LAB — MICROSCOPIC EXAMINATION
BACTERIA UA: NONE SEEN
Casts: NONE SEEN /lpf
Epithelial Cells (non renal): >10 /hpf — AB (ref 0–10)

## 2018-02-16 LAB — ABO AND RH: Rh Factor: POSITIVE

## 2018-02-16 LAB — HIV ANTIBODY (ROUTINE TESTING W REFLEX): HIV Screen 4th Generation wRfx: NONREACTIVE

## 2018-02-16 LAB — PROTIME-INR
INR: 1 (ref 0.8–1.2)
PROTHROMBIN TIME: 10.4 s (ref 9.1–12.0)

## 2018-02-16 LAB — VARICELLA ZOSTER ANTIBODY, IGG: VARICELLA: 325 {index} (ref >165)

## 2018-02-16 LAB — ANTIBODY SCREEN: Antibody Screen: NEGATIVE

## 2018-02-16 LAB — RUBELLA SCREEN: Rubella Antibodies, IGG: 7.92 index (ref >0.99)

## 2018-02-17 LAB — CULTURE, OB URINE

## 2018-02-17 LAB — URINE CULTURE, OB REFLEX

## 2018-02-18 LAB — GC/CHLAMYDIA PROBE AMP
CHLAMYDIA, DNA PROBE: NEGATIVE
Neisseria gonorrhoeae by PCR: NEGATIVE
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
## 2018-03-01 ENCOUNTER — Encounter: Payer: Self-pay | Admitting: Certified Nurse Midwife

## 2018-03-01 ENCOUNTER — Ambulatory Visit (INDEPENDENT_AMBULATORY_CARE_PROVIDER_SITE_OTHER): Payer: Medicaid Other | Admitting: Certified Nurse Midwife

## 2018-03-01 ENCOUNTER — Ambulatory Visit (INDEPENDENT_AMBULATORY_CARE_PROVIDER_SITE_OTHER): Payer: Medicaid Other

## 2018-03-01 VITALS — BP 107/66 | HR 107 | Wt 180.3 lb

## 2018-03-01 DIAGNOSIS — Z3A11 11 weeks gestation of pregnancy: Secondary | ICD-10-CM

## 2018-03-01 DIAGNOSIS — Z3481 Encounter for supervision of other normal pregnancy, first trimester: Secondary | ICD-10-CM | POA: Diagnosis not present

## 2018-03-01 DIAGNOSIS — Z3687 Encounter for antenatal screening for uncertain dates: Secondary | ICD-10-CM | POA: Diagnosis not present

## 2018-03-01 LAB — POCT URINALYSIS DIPSTICK OB
BILIRUBIN UA: NEGATIVE
Glucose, UA: NEGATIVE
Ketones, UA: NEGATIVE
Leukocytes, UA: NEGATIVE
Nitrite, UA: NEGATIVE
Spec Grav, UA: 1.02 (ref 1.010–1.025)
Urobilinogen, UA: 0.2 E.U./dL
pH, UA: 6 (ref 5.0–8.0)

## 2018-03-01 MED ORDER — PANTOPRAZOLE SODIUM 20 MG PO TBEC: 20.0000 mg | DELAYED_RELEASE_TABLET | Freq: Every day | ORAL | 0 refills | Status: DC

## 2018-03-01 NOTE — Progress Notes (Signed)
NEW OB HISTORY AND PHYSICAL  SUBJECTIVE:       Madison Jenkins is a 23 y.o. [id number] female, Patient's last menstrual period was 12/09/2017., Estimated Date of Delivery: 09/15/18, [redacted]w[redacted]d, presents today for establishment of Prenatal Care.  Reports increased reflux, nausea without vomiting and breast tenderness.   Desires cell free DNA as genetic screening. Prefers Pap next visit.   Denies difficulty breathing or respiratory distress, chest pain, abdominal pain, vaginal bleeding, dysuria, and leg pain or swelling.    Gynecologic History  Patient's last menstrual period was 12/09/2017.   Contraception: abstinence   Last Pap: due. Prefers collection at next visit.   Obstetric History  OB History  Gravida Para Term Preterm AB Living  3 1 1   1 1   SAB TAB Ectopic Multiple Live Births  1     0 1    # Outcome Date GA Lbr Len/2nd Weight Sex Delivery Anes PTL Lv  3 Current           2 Term 02/27/15 [redacted]w[redacted]d 02:21 / 00:26 7 lb 8.6 oz (3.42 kg) F Vag-Spont EPI, Local  LIV  1 SAB             Past Medical History:  Diagnosis Date  . ADD (attention deficit disorder)    ADHD as per pt  . Asthma   . Depression   . Eczema   . Fatty liver   . Headache   . High cholesterol   . Murmur   . Orthostatic hypotension     Past Surgical History:  Procedure Laterality Date  . TONSILLECTOMY AND ADENOIDECTOMY      Current Outpatient Medications on File Prior to Visit  Medication Sig Dispense Refill  . Prenatal Vit-Fe Fumarate-FA (PRENATAL MULTIVITAMIN) TABS tablet Take 1 tablet by mouth daily at 12 noon.     No current facility-administered medications on file prior to visit.     Allergies  Allergen Reactions  . Peanut-Containing Drug Products   . Penicillins Anaphylaxis  . Sulfa Antibiotics Anaphylaxis  . Amoxicillin Rash    Social History   Socioeconomic History  . Marital status: Married    Spouse name: Not on file  . Number of children: Not on file  . Years of education: Not  on file  . Highest education level: Not on file  Occupational History  . Not on file  Social Needs  . Financial resource strain: Not on file  . Food insecurity:    Worry: Not on file    Inability: Not on file  . Transportation needs:    Medical: Not on file    Non-medical: Not on file  Tobacco Use  . Smoking status: Former Smoker    Packs/day: 0.50    Years: 0.50    Pack years: 0.25    Last attempt to quit: 05/06/2015    Years since quitting: 2.8  . Smokeless tobacco: Former Engineer, water and Sexual Activity  . Alcohol use: Not Currently    Comment: occas  . Drug use: No  . Sexual activity: Yes    Birth control/protection: None  Lifestyle  . Physical activity:    Days per week: Not on file    Minutes per session: Not on file  . Stress: Not on file  Relationships  . Social connections:    Talks on phone: Not on file    Gets together: Not on file    Attends religious service: Not on file    Active  member of club or organization: Not on file    Attends meetings of clubs or organizations: Not on file    Relationship status: Not on file  . Intimate partner violence:    Fear of current or ex partner: Not on file    Emotionally abused: Not on file    Physically abused: Not on file    Forced sexual activity: Not on file  Other Topics Concern  . Not on file  Social History Narrative  . Not on file    Family History  Problem Relation Age of Onset  . Breast cancer Paternal Grandmother   . Cancer - Other Mother        ovarian  . Thyroid disease Mother   . Lupus Mother   . Graves' disease Mother     The following portions of the patient's history were reviewed and updated as appropriate: allergies, current medications, past OB history, past medical history, past surgical history, past family history, past social history, and problem list.    OBJECTIVE:  BP 107/66   Pulse (!) 107   Wt 180 lb 5 oz (81.8 kg)   LMP 12/09/2017   BMI 26.63 kg/m   Initial Physical  Exam (New OB)  GENERAL APPEARANCE: alert, well appearing, in no apparent distress  HEAD: normocephalic, atraumatic  MOUTH: mucous membranes moist, pharynx normal without lesions  THYROID: no thyromegaly or masses present  BREASTS: patient declined exam  LUNGS: clear to auscultation, no wheezes, rales or rhonchi, symmetric air entry  HEART: regular rate and rhythm, no murmurs  ABDOMEN: soft, nontender, nondistended, no abnormal masses, no epigastric pain and FHT present  EXTREMITIES: no redness or tenderness in the calves or thighs, no edema  SKIN: normal coloration and turgor, no rashes  LYMPH NODES: no adenopathy palpable  NEUROLOGIC: alert, oriented, normal speech, no focal findings or movement disorder noted  PELVIC EXAM: patient declined exam  ULTRASOUND REPORT  Location: [facility] Date of Service: 03/01/2018   Indications:dating Findings:  Mason Jim intrauterine pregnancy is visualized with a CRL consistent with [redacted]w[redacted]d gestation, giving an (U/S) EDD of 09/17/18.   FHR: 168 BPM CRL measurement: 46.5 mm Yolk sac is not visualized and early anatomy is normal. Amnion: visualized and appears normal   Right Ovary is normal in appearance. Left Ovary is normal appearance. Corpus luteal cyst:  is not visualized Survey of the adnexa demonstrates no adnexal masses. There is no free peritoneal fluid in the cul de sac.  Impression: 1. [redacted]w[redacted]d Viable Singleton Intrauterine pregnancy by U/S. 2. (U/S) EDD is 09/17/18  Recommendations: 1.Clinical correlation with the patient's History and Physical Exam.  ASSESSMENT: Normal pregnancy Rh positive Nausea and vomiting in pregnancy Reflux in pregnancy Desires genetic screening  PLAN: Rx: Protonix, see orders Panorama today, see orders Prenatal care New OB counseling: The patient has been given an overview regarding routine prenatal care. Recommendations regarding diet, weight gain, and exercise in pregnancy were  given. Prenatal testing, optional genetic testing, and ultrasound use in pregnancy were reviewed.  Benefits of Breast Feeding were discussed. The patient is encouraged to consider nursing her baby post partum. See orders   Gunnar Bulla, CNM [facility], [facility] 03/01/18 10:44 AM

## 2018-03-02 LAB — COMPREHENSIVE METABOLIC PANEL
ALT: 10 IU/L (ref 0–32)
AST: 15 IU/L (ref 0–40)
Albumin/Globulin Ratio: 1.7 (ref 1.2–2.2)
Albumin: 4.3 g/dL (ref 3.9–5.0)
Alkaline Phosphatase: 56 IU/L (ref 39–117)
BUN/Creatinine Ratio: 9 (ref 9–23)
BUN: 6 mg/dL (ref 6–20)
Bilirubin Total: 0.3 mg/dL (ref 0.0–1.2)
CO2: 20 mmol/L (ref 20–29)
Calcium: 9.7 mg/dL (ref 8.7–10.2)
Chloride: 103 mmol/L (ref 96–106)
Creatinine, Ser: 0.7 mg/dL (ref 0.57–1.00)
GFR calc Af Amer: 142 mL/min/{1.73_m2} (ref >59)
GFR calc non Af Amer: 123 mL/min/{1.73_m2} (ref >59)
Globulin, Total: 2.5 g/dL (ref 1.5–4.5)
Glucose: 82 mg/dL (ref 65–99)
Potassium: 4.3 mmol/L (ref 3.5–5.2)
SODIUM: 139 mmol/L (ref 134–144)
Total Protein: 6.8 g/dL (ref 6.0–8.5)

## 2018-03-08 ENCOUNTER — Telehealth: Payer: Self-pay | Admitting: Certified Nurse Midwife

## 2018-03-08 NOTE — Telephone Encounter (Signed)
The patient called and stated that she would like to speak with a nurse in regards to the status of her bloodwork. Please advise.Madison Jenkins

## 2018-03-29 ENCOUNTER — Encounter: Payer: Medicaid Other | Admitting: Certified Nurse Midwife

## 2018-04-07 ENCOUNTER — Other Ambulatory Visit: Payer: Self-pay | Admitting: Obstetrics and Gynecology

## 2018-04-07 DIAGNOSIS — Z3492 Encounter for supervision of normal pregnancy, unspecified, second trimester: Secondary | ICD-10-CM

## 2018-04-14 ENCOUNTER — Emergency Department
Admission: EM | Admit: 2018-04-14 | Discharge: 2018-04-14 | Disposition: A | Discharge status: Home / Self Care | Payer: Medicaid Other | Attending: Emergency Medicine | Admitting: Emergency Medicine

## 2018-04-14 ENCOUNTER — Encounter: Payer: Self-pay | Admitting: *Deleted

## 2018-04-14 ENCOUNTER — Other Ambulatory Visit: Payer: Self-pay

## 2018-04-14 ENCOUNTER — Telehealth: Payer: Self-pay | Admitting: Obstetrics and Gynecology

## 2018-04-14 DIAGNOSIS — Z79899 Other long term (current) drug therapy: Secondary | ICD-10-CM | POA: Diagnosis not present

## 2018-04-14 DIAGNOSIS — Z87891 Personal history of nicotine dependence: Secondary | ICD-10-CM | POA: Diagnosis not present

## 2018-04-14 DIAGNOSIS — R067 Sneezing: Secondary | ICD-10-CM | POA: Diagnosis present

## 2018-04-14 DIAGNOSIS — O99512 Diseases of the respiratory system complicating pregnancy, second trimester: Secondary | ICD-10-CM | POA: Diagnosis not present

## 2018-04-14 DIAGNOSIS — Z3A20 20 weeks gestation of pregnancy: Secondary | ICD-10-CM | POA: Diagnosis not present

## 2018-04-14 DIAGNOSIS — O9952 Diseases of the respiratory system complicating childbirth: Secondary | ICD-10-CM | POA: Insufficient documentation

## 2018-04-14 DIAGNOSIS — J45909 Unspecified asthma, uncomplicated: Secondary | ICD-10-CM | POA: Insufficient documentation

## 2018-04-14 DIAGNOSIS — Z880 Allergy status to penicillin: Secondary | ICD-10-CM | POA: Diagnosis not present

## 2018-04-14 NOTE — ED Triage Notes (Signed)
Pt ambulatory to triage.  Pt is 5 months pregnant.  Pt has been around someone at work with a fever and cough and now patient wants to be tested for covid 19.  Pt has been sneezing a lot.  No cough or fever.  Pt alert  Speech clear

## 2018-04-14 NOTE — ED Provider Notes (Signed)
[facility] Emergency Department Provider Note  ____________________________________________   I have reviewed the triage vital signs and the nursing notes.   HISTORY  Chief Complaint Sneezing  History limited by: Not Limited   HPI Madison Jenkins is a 23 y.o. female who presents to the emergency department today sent by Ob/Gyns office for COVID screening test. The patient states that one of her coworkers has had fever and cough. The patient is 5 months pregnant. She called her ob/gyn office and they advised her to come to the emergency department for a screening test. The patient's only symptom is sneezing. Denies any fevers. No chest pain or shortness of breath.   Records reviewed. Per medical record review patient has a history of asthma.   Past Medical History:  Diagnosis Date  . ADD (attention deficit disorder)    ADHD as per pt  . Asthma   . Depression   . Eczema   . Fatty liver   . Headache   . High cholesterol   . Murmur   . Orthostatic hypotension     Patient Active Problem List   Diagnosis Date Noted  . Back pain 02/23/2015    Past Surgical History:  Procedure Laterality Date  . TONSILLECTOMY AND ADENOIDECTOMY      Prior to Admission medications   Medication Sig Start Date End Date Taking? Authorizing Provider  pantoprazole (PROTONIX) 20 MG tablet Take 1 tablet (20 mg total) by mouth at bedtime. 03/01/18   Gunnar BullaLawhorn, Jenkins Michelle, CNM  Prenatal Vit-Fe Fumarate-FA (PRENATAL MULTIVITAMIN) TABS tablet Take 1 tablet by mouth daily at 12 noon.    [provider]    Allergies Peanut-containing drug products; Penicillins; Sulfa antibiotics; and Amoxicillin  Family History  Problem Relation Age of Onset  . Breast cancer Paternal Grandmother   . Cancer - Other Mother        ovarian  . Thyroid disease Mother   . Lupus Mother   . Graves' disease Mother     Social History Social History   Tobacco Use  . Smoking status:  Former Smoker    Packs/day: 0.50    Years: 0.50    Pack years: 0.25    Last attempt to quit: 05/06/2015    Years since quitting: 2.9  . Smokeless tobacco: Former Engineer, waterUser  Substance Use Topics  . Alcohol use: Not Currently    Comment: occas  . Drug use: No    Review of Systems Constitutional: No fever/chills Eyes: No visual changes. ENT: No sore throat. Positive for sneezing.  Cardiovascular: Denies chest pain. Respiratory: Denies shortness of breath. Gastrointestinal: No abdominal pain.  No nausea, no vomiting.  No diarrhea.   Genitourinary: Negative for dysuria. Musculoskeletal: Negative for back pain. Skin: Negative for rash. Neurological: Negative for headaches, focal weakness or numbness.  ____________________________________________   PHYSICAL EXAM:  VITAL SIGNS: ED Triage Vitals  Enc Vitals Group     BP 04/14/18 1618 118/61     Pulse Rate 04/14/18 1618 93     Resp 04/14/18 1618 20     Temp 04/14/18 1618 98.4 F (36.9 C)     Temp Source 04/14/18 1618 Oral     SpO2 04/14/18 1618 98 %     Weight 04/14/18 1620 179 lb (81.2 kg)     Height 04/14/18 1620 5\' 9"  (1.753 m)     Head Circumference --      Peak Flow --      Pain Score 04/14/18 1620 0  Constitutional: Alert and oriented.  Eyes: Conjunctivae are normal.  ENT      Head: Normocephalic and atraumatic.      Nose: No congestion/rhinnorhea.      Mouth/Throat: Mucous membranes are moist.      Neck: No stridor. Hematological/Lymphatic/Immunilogical: No cervical lymphadenopathy. Cardiovascular: Normal rate, regular rhythm. Systolic murmur.  Respiratory: Normal respiratory effort without tachypnea nor retractions. Breath sounds are clear and equal bilaterally. No wheezes/rales/rhonchi. Gastrointestinal: Soft and non tender. No rebound. No guarding.  Genitourinary: Deferred Musculoskeletal: Normal range of motion in all extremities. No lower extremity edema. Neurologic:  Normal speech and language. No gross focal  neurologic deficits are appreciated.  Skin:  Skin is warm, dry and intact. No rash noted. Psychiatric: Mood and affect are normal. Speech and behavior are normal. Patient exhibits appropriate insight and judgment.  ____________________________________________    LABS (pertinent positives/negatives)  None  ____________________________________________   EKG  None  ____________________________________________    RADIOLOGY  None  ____________________________________________   PROCEDURES  Procedures  ____________________________________________   INITIAL IMPRESSION / ASSESSMENT AND PLAN / ED COURSE  Pertinent labs & imaging results that were available during my care of the patient were reviewed by me and considered in my medical decision making (see chart for details).   Patient sent by OB/GYN doctor's office for Covidien screening test.  Discussed with patient this time we are unable to do screening tests given lack of testing availability.  Furthermore she will not meet criteria for testing at this time.  Patient's only symptom is sneezing.  She is in no distress.  Discussed with patient she had asked her OB/GYN about recommendations for possible allergy medications.  ____________________________________________   FINAL CLINICAL IMPRESSION(S) / ED DIAGNOSES  Final diagnoses:  Sneezing     Note: This dictation was prepared with Dragon dictation. Any transcriptional errors that result from this process are unintentional     Phineas Semen, MD 04/14/18 1635

## 2018-04-14 NOTE — Telephone Encounter (Signed)
Called pt states a coworker was sick last week, coughing, sneezing, just found out she was tested positive for covid19, advised pt to ED for screening

## 2018-04-14 NOTE — Telephone Encounter (Signed)
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 Please advise.

## 2018-04-14 NOTE — Discharge Instructions (Addendum)
Please seek medical attention for any high fevers, chest pain, shortness of breath, change in behavior, persistent vomiting, bloody stool or any other new or concerning symptoms.  

## 2018-04-18 ENCOUNTER — Telehealth: Payer: Self-pay | Admitting: *Deleted

## 2018-04-18 NOTE — Telephone Encounter (Signed)
Coronavirus (COVID-19) Are you at risk?  Are you at risk for the Coronavirus (COVID-19)?  To be considered HIGH RISK for Coronavirus (COVID-19), you have to meet the following criteria:  . Traveled to [place], [place], [place], [place] or [place]; or in the [place] to [place], [place], [place], or [place]; and have fever, cough, and shortness of breath within the last 2 weeks of travel OR . Been in close contact with a person diagnosed with COVID-19 within the last 2 weeks and have fever, cough, and shortness of breath . IF YOU DO NOT MEET THESE CRITERIA, YOU ARE CONSIDERED LOW RISK FOR COVID-19.  What to do if you are HIGH RISK for COVID-19?  . If you are having a medical emergency, call 911. . Seek medical care right away. Before you go to a doctor's office, urgent care or emergency department, call ahead and tell them about your recent travel, contact with someone diagnosed with COVID-19, and your symptoms. You should receive instructions from your physician's office regarding next steps of care.  . When you arrive at healthcare provider, tell the healthcare staff immediately you have returned from visiting [place], [place], [place], [place] or [place]; or traveled in the [place] to [place], [place], [place], or [place]; in the last two weeks or you have been in close contact with a person diagnosed with COVID-19 in the last 2 weeks.   . Tell the health care staff about your symptoms: fever, cough and shortness of breath. . After you have been seen by a medical provider, you will be either: o Tested for (COVID-19) and discharged home on quarantine except to seek medical care if symptoms worsen, and asked to  - Stay home and avoid contact with others until you get your results (4-5 days)  - Avoid travel on public transportation if possible (such as bus, train, or airplane) or o Sent to the Emergency Department by EMS for evaluation, COVID-19 testing, and possible  admission depending on your condition and test results.  What to do if you are LOW RISK for COVID-19?  Reduce your risk of any infection by using the same precautions used for avoiding the common cold or flu:  . Wash your hands often with soap and warm water for at least 20 seconds.  If soap and water are not readily available, use an alcohol-based hand sanitizer with at least 60% alcohol.  . If coughing or sneezing, cover your mouth and nose by coughing or sneezing into the elbow areas of your shirt or coat, into a tissue or into your sleeve (not your hands). . Avoid shaking hands with others and consider head nods or verbal greetings only. . Avoid touching your eyes, nose, or mouth with unwashed hands.  . Avoid close contact with people who are sick. . Avoid places or events with large numbers of people in one location, like concerts or sporting events. . Carefully consider travel plans you have or are making. . If you are planning any travel outside or inside the [place], visit the CDC's Travelers' Health webpage for the latest health notices. . If you have some symptoms but not all symptoms, continue to monitor at home and seek medical attention if your symptoms worsen. . If you are having a medical emergency, call 911.   ADDITIONAL HEALTHCARE OPTIONS FOR PATIENTS  [name]: [email or web address]         [name]: [phone]  Madison Jenkins   Urgent [facility]: [phone]                   [name]: [phone]   [name]  Madison Jenkins, CMA 

## 2018-04-18 NOTE — Telephone Encounter (Signed)
Called pt to do screening

## 2018-04-19 ENCOUNTER — Other Ambulatory Visit: Payer: Self-pay

## 2018-04-19 ENCOUNTER — Ambulatory Visit (INDEPENDENT_AMBULATORY_CARE_PROVIDER_SITE_OTHER): Payer: Medicaid Other

## 2018-04-19 ENCOUNTER — Ambulatory Visit (INDEPENDENT_AMBULATORY_CARE_PROVIDER_SITE_OTHER): Payer: Medicaid Other | Admitting: Obstetrics and Gynecology

## 2018-04-19 ENCOUNTER — Encounter: Payer: Self-pay | Admitting: Obstetrics and Gynecology

## 2018-04-19 VITALS — BP 109/67 | HR 101 | Wt 178.2 lb

## 2018-04-19 DIAGNOSIS — Z3492 Encounter for supervision of normal pregnancy, unspecified, second trimester: Secondary | ICD-10-CM

## 2018-04-19 DIAGNOSIS — Z363 Encounter for antenatal screening for malformations: Secondary | ICD-10-CM

## 2018-04-19 DIAGNOSIS — Z3481 Encounter for supervision of other normal pregnancy, first trimester: Secondary | ICD-10-CM

## 2018-04-19 LAB — POCT URINALYSIS DIPSTICK OB
Bilirubin, UA: NEGATIVE
Blood, UA: NEGATIVE
Glucose, UA: NEGATIVE
Ketones, UA: NEGATIVE
Leukocytes, UA: NEGATIVE
Nitrite, UA: NEGATIVE
POC,PROTEIN,UA: NEGATIVE
Spec Grav, UA: 1.02 (ref 1.010–1.025)
Urobilinogen, UA: 0.2 E.U./dL
pH, UA: 6 (ref 5.0–8.0)

## 2018-04-19 NOTE — Progress Notes (Signed)
ROB- doing well, anatomy scan done today, discussed heartburn.    Pap obtained- cervix friable, reassured patient that bleeding is normal, not working currently due to [employer]

## 2018-04-19 NOTE — Addendum Note (Signed)
Addended by: Brooke Dare on: 04/19/2018 04:22 PM   Modules accepted: Orders

## 2018-04-19 NOTE — Patient Instructions (Signed)
FREQUENTLY ASKED QUESTIONS FOR OBSTETRICS/PEDIATRICS    Q: Why are visitor restrictions different for maternity care areas?  [facility] is restricting visitors for the duration of the patient's hospitalization. The birth of a child involves the mother, considered the patient, and a birthing partner. These are unprecedented times and we are making the exception to allow a birthing partner to be a part of the patient unit. No other guests will be allowed in our [facility] at [facility] and at [facility].   Q: Are credentialed doulas allowed to support their existing patients?  We acknowledge the value these doula partnerships offer our care teams and many birthing families in our communities. Each laboring mother is allowed one birthing partner of the patient's choosing for her entire hospitalization.   Q: Are visitor restrictions different for hospitalized children?  Pediatric patients (infants and children under 29 years of age), such as those in the Children's Unit, Pediatric ICU and NICU, will be allowed two visitors (parents or legal guardians)   Q: Are pregnant women at an increased risk for COVID-19?  The [employer] of Obstetricians and Gynecologists (ACOG) is monitoring closely the coronavirus pandemic. With the limited information available, data does not indicate pregnant women are at an increased risk. However, pregnant women are known to be at greater risk for respiratory infections like flu. With that in mind, expectant mothers are considered an at-risk population for COVID-19, according to ACOG.   Q: Are newborns at an increased risk for COVID-19?  A limited sample of COVID-19 data with newborns indicates the virus is not transferred to the infant during pregnancy. However, postpartum separation is recommended by the Centers for  Disease Control (CDC). As a result [facility] recommends and strongly encourages temporary separation of moms and babies who test positive for COVID-19 or are awaiting results to rule out COVID-19 based on CDC guidelines.   Q: If you have a suspected case of COVID-19, is the NICU couplet care room an option?  No. If either patient is considered at-risk for having COVID-19, the [facility] Center at [facility] will not use the NICU couplet care rooms for that family.   Q: [facility] is urging that elective procedures be postponed. What is considered elective for women's and children's service line?  NOT ELECTIVE: Obstetric procedures, even those with an element of choice on timing, are not considered elective. Circumcisions are considered elective procedures, however, these do not deplete blood products and other resources, which is the spirit in which the COVID-19 postponement of elective procedures was intended. Therefore, circumcisions will be allowed.   ELECTIVE: Postpartum tubal ligations are considered elective and should be postponed. Q&A for Obstetricians, Gynecologists and Pediatricians  Published March 25, 2018   [facility] supports as much as possible the medical  care team working with the patient's individual needs to address timing during these unprecedented times. We seek the support of our medical care team in preserving needed resources throughout our crisis response to COVID-19.   Q: How does COVID-19 impact breastfeeding?  Breastmilk is safe for your baby - even if the mother has tested positive for COVID-19. If a COVID-19+ mother decides to breastfeed while inpatient and after discharge, we suggest proper protective equipment be worn and hand hygiene be performed before and after feeding the infant. The new mother also has the option to pump her milk and have a healthy family member feed the baby to protect the baby from getting the virus.   Q: Should we urge  patients to avoid baby showers and large gatherings?  Yes. As has been recommended for all citizens in our communities, gatherings of 10 or more should be avoided - pregnant or not. Seek creative options for "hosting" baby showers through electronic means that honor the request for social distancing during this time of heightened awareness.   Q: Should patients miss their prenatal appointments?  No. Prenatal visits are NOT elective. While we want to limit contact and exposure, prenatal care is vital right now. Contact your physician's office if you have concerns about your visits. We are limiting outpatient office visits to the patient and one guest in order to reduce the potential for exposure.   Q: What if a pregnant woman feels sick? Should she miss her prenatal visit then?  A pregnant woman experiencing coronavirus-like symptoms (i.e., cough, fever, difficulty breathing, shortness of breath, gastrointestinal issues) should contact her pregnancy care provider by phone. Her medical professional can best determine whether she should use a video visit or possibly go to a collection site to be tested for COVID-19. Contacting her primary care provider or her pregnancy care provider is her first step.   Q: What can I do about childbirth education? All the classes are cancelled.  The [facility] Children[facility] Centers will offer online learning to support mothers on their journey. We currently offer Understanding Childbirth, Understanding Breastfeeding and Understanding Newborn Care as an online class. Please visit our website, [email or web address], to register for an online class.   Q: How can I keep from getting COVID-19? Q&A for Obstetricians, Gynecologists and Pediatricians  Published March 25, 2018   Together, we can reduce the risk of exposure to the virus and help you and your family remain healthy and safe. One of the best ways to protect yourself is to wash your hands frequently using soap and  water. Also, you should avoid touching your eyes, nose and mouth with unwashed hands, avoid physical contact with others and practice social distancing.   Q: How are employees being informed about what to do?  [facility] leaders receive a daily COVID-19 update and share relevant information with their teams. This is a time when health care professionals are called on to lead within our community. We appreciate our staff's engagement with our COVID-19 updates and encourage them to share best practices on reducing the spread of the virus with our patients and community. We are prepared to provide the exceptional COVID-19 care and coordination our community needs, expects and deserves.   Q: Who's in charge of this issue at [facility]?  The leadership structure and process established to address COVID-19 includes Chief Physician Executive Birdie Sons, MD; Infection Prevention Medical Director Judyann Munson, MD; and Infection Prevention Interim Director Jason Fila, MSN, RN, CIC, CSPDT. A  of [facility] experts reflecting a broad spectrum of our workforce is meeting daily to evaluate new information we receive about COVID-19 and to adapt policies and practices accordingly.  ° ° °                     Published March 25, 2018  ° ° °Heartburn During Pregnancy °Heartburn is a type of pain or discomfort that can happen in the throat or chest. It is often described as a burning sensation. Heartburn is common during pregnancy because: °· A hormone (progesterone) that is released during pregnancy may relax the valve (lower esophageal sphincter, or LES) that separates the esophagus from the stomach. This allows stomach acid to move up into the esophagus, causing heartburn. °· The uterus gets larger and pushes up on the stomach, which pushes more acid into the esophagus. This is especially true in the later stages of pregnancy. °Heartburn usually goes away or gets better after giving birth. °What are the  causes? °Heartburn is caused by stomach acid backing up into the esophagus (reflux). Reflux can be triggered by: °· Changing hormone levels. °· Large meals. °· Certain foods and beverages, such as coffee, chocolate, onions, and peppermint. °· Exercise. °· Increased stomach acid production. °What increases the risk? °You are more likely to experience heartburn during pregnancy if you: °· Had heartburn prior to becoming pregnant. °· Have been pregnant more than once before. °· Are overweight or obese. °The likelihood that you will get heartburn also increases as you get farther along in your pregnancy, especially during the last trimester. °What are the signs or symptoms? °Symptoms of this condition include: °· Burning pain in the chest or lower throat. °· Bitter taste in the mouth. °· Coughing. °· Problems swallowing. °· Vomiting. °· Hoarse voice. °· Asthma. °Symptoms may get worse when you lie down or bend over. Symptoms are often worse at night. °How is this diagnosed? °This condition is diagnosed based on: °· Your medical history. °· Your symptoms. °· Blood tests to check for a certain type of bacteria associated with heartburn. °· Whether taking heartburn medicine relieves your symptoms. °· Examination of the stomach and esophagus using a tube with a light and camera on the end (endoscopy). °How is this treated? °Treatment varies depending on how severe your symptoms are. Your health care provider may recommend: °· Over-the-counter medicines (antacids or acid reducers) for mild heartburn. °· Prescription medicines to decrease stomach acid or to protect your stomach lining. °· Certain changes in your diet. °· Raising the head of your bed so it is higher than the foot of the bed. This helps prevent stomach acid from backing up into the esophagus when you are lying down. °Follow these instructions at home: °Eating and drinking °· Do not drink alcohol during your pregnancy. °· Identify foods and beverages that make  your symptoms worse, and avoid them. °· Beverages that you may want to avoid include: °? Coffee and tea (with or without caffeine). °? Energy drinks and sports drinks. °? Carbonated drinks or sodas. °? Citrus fruit juices. °· Foods that you may want to avoid include: °? Chocolate and cocoa. °? Peppermint and mint flavorings. °? Garlic, onions, and horseradish. °? Spicy and acidic foods, including peppers, chili powder, curry powder, vinegar, hot sauces, and barbecue sauce. °? Citrus fruits, such as oranges, lemons, and limes. °? Tomato-based foods, such as red sauce, chili, and salsa. °? Fried and fatty foods, such as donuts, french fries, potato chips,   and high-fat dressings. °? High-fat meats, such as hot dogs, cold cuts, sausage, ham, and bacon. °? High-fat dairy items, such as whole milk, butter, and cheese. °· Eat small, frequent meals instead of large meals. °· Avoid drinking large amounts of liquid with your meals. °· Avoid eating meals during the 2-3 hours before bedtime. °· Avoid lying down right after you eat. °· Do not exercise right after you eat. °Medicines °· Take over-the-counter and prescription medicines only as told by your health care provider. °· Do not take aspirin, ibuprofen, or other NSAIDs unless your health care provider tells you to do that. °· You may be instructed to avoid medicines that contain sodium bicarbonate. °General instructions ° °· If directed, raise the head of your bed about 6 inches (15 cm) by putting blocks under the legs. Sleeping with more pillows does not effectively relieve heartburn because it only changes the position of your head. °· Do not use any products that contain nicotine or tobacco, such as cigarettes and e-cigarettes. If you need help quitting, ask your health care provider. °· Wear loose-fitting clothing. °· Try to reduce your stress, such as with yoga or meditation. If you need help managing stress, ask your health care provider. °· Maintain a healthy  weight. If you are overweight, work with your health care provider to safely lose weight. °· Keep all follow-up visits as told by your health care provider. This is important. °Contact a health care provider if: °· You develop new symptoms. °· Your symptoms do not improve with treatment. °· You have unexplained weight loss. °· You have difficulty swallowing. °· You make loud sounds when you breathe (wheeze). °· You have a cough that does not go away. °· You have frequent heartburn for more than 2 weeks. °· You have nausea or vomiting that does not get better with treatment. °· You have pain in your abdomen. °Get help right away if: °· You have severe chest pain that spreads to your arm, neck, or jaw. °· You feel sweaty, dizzy, or light-headed. °· You have shortness of breath. °· You have pain when swallowing. °· You vomit, and your vomit looks like blood or coffee grounds. °· Your stool is bloody or black. °This information is not intended to replace advice given to you by your health care provider. Make sure you discuss any questions you have with your health care provider. °Document Released: 12/20/1999 Document Revised: 09/09/2015 Document Reviewed: 09/09/2015 °Elsevier Interactive Patient Education © 2019 [employer] ° °

## 2018-04-20 ENCOUNTER — Other Ambulatory Visit (HOSPITAL_COMMUNITY)
Admission: RE | Admit: 2018-04-20 | Discharge: 2018-04-20 | Disposition: A | Discharge status: Home / Self Care | Payer: Medicaid Other | Source: Ambulatory Visit | Attending: Certified Nurse Midwife | Admitting: Certified Nurse Midwife

## 2018-04-20 DIAGNOSIS — Z3481 Encounter for supervision of other normal pregnancy, first trimester: Secondary | ICD-10-CM | POA: Insufficient documentation

## 2018-04-20 NOTE — Addendum Note (Signed)
Addended by: Rosine Beat L on: 04/20/2018 07:47 AM   Modules accepted: Orders

## 2018-04-23 LAB — CYTOLOGY - PAP: Diagnosis: NEGATIVE

## 2018-05-18 ENCOUNTER — Telehealth: Payer: Self-pay | Admitting: *Deleted

## 2018-05-18 NOTE — Telephone Encounter (Signed)
Coronavirus (COVID-19) Are you at risk?  Are you at risk for the Coronavirus (COVID-19)?  To be considered HIGH RISK for Coronavirus (COVID-19), you have to meet the following criteria:  . Traveled to [place], [place], [place], [place] or [place]; or in the [place] to [place], [place], [place], or [place]; and have fever, cough, and shortness of breath within the last 2 weeks of travel OR . Been in close contact with a person diagnosed with COVID-19 within the last 2 weeks and have fever, cough, and shortness of breath . IF YOU DO NOT MEET THESE CRITERIA, YOU ARE CONSIDERED LOW RISK FOR COVID-19.  What to do if you are HIGH RISK for COVID-19?  . If you are having a medical emergency, call 911. . Seek medical care right away. Before you go to a doctor's office, urgent care or emergency department, call ahead and tell them about your recent travel, contact with someone diagnosed with COVID-19, and your symptoms. You should receive instructions from your physician's office regarding next steps of care.  . When you arrive at healthcare provider, tell the healthcare staff immediately you have returned from visiting [place], [place], [place], [place] or [place]; or traveled in the [place] to [place], [place], [place], or [place]; in the last two weeks or you have been in close contact with a person diagnosed with COVID-19 in the last 2 weeks.   . Tell the health care staff about your symptoms: fever, cough and shortness of breath. . After you have been seen by a medical provider, you will be either: o Tested for (COVID-19) and discharged home on quarantine except to seek medical care if symptoms worsen, and asked to  - Stay home and avoid contact with others until you get your results (4-5 days)  - Avoid travel on public transportation if possible (such as bus, train, or airplane) or o Sent to the Emergency Department by EMS for evaluation, COVID-19 testing, and possible  admission depending on your condition and test results.  What to do if you are LOW RISK for COVID-19?  Reduce your risk of any infection by using the same precautions used for avoiding the common cold or flu:  . Wash your hands often with soap and warm water for at least 20 seconds.  If soap and water are not readily available, use an alcohol-based hand sanitizer with at least 60% alcohol.  . If coughing or sneezing, cover your mouth and nose by coughing or sneezing into the elbow areas of your shirt or coat, into a tissue or into your sleeve (not your hands). . Avoid shaking hands with others and consider head nods or verbal greetings only. . Avoid touching your eyes, nose, or mouth with unwashed hands.  . Avoid close contact with people who are sick. . Avoid places or events with large numbers of people in one location, like concerts or sporting events. . Carefully consider travel plans you have or are making. . If you are planning any travel outside or inside the [place], visit the CDC's Travelers' Health webpage for the latest health notices. . If you have some symptoms but not all symptoms, continue to monitor at home and seek medical attention if your symptoms worsen. . If you are having a medical emergency, call 911.   ADDITIONAL HEALTHCARE OPTIONS FOR PATIENTS  [facility] / e-Visit: [email or web address]         [name]: [phone]     Urgent Care: [phone]                   [facility]: [phone]   [name]   , CMA 

## 2018-05-19 ENCOUNTER — Encounter: Payer: Medicaid Other | Admitting: Certified Nurse Midwife

## 2018-05-27 ENCOUNTER — Ambulatory Visit (INDEPENDENT_AMBULATORY_CARE_PROVIDER_SITE_OTHER): Payer: Medicaid Other | Admitting: Certified Nurse Midwife

## 2018-05-27 ENCOUNTER — Other Ambulatory Visit: Payer: Self-pay

## 2018-05-27 VITALS — BP 109/67 | HR 90 | Wt 184.4 lb

## 2018-05-27 DIAGNOSIS — O4440 Low lying placenta NOS or without hemorrhage, unspecified trimester: Secondary | ICD-10-CM

## 2018-05-27 DIAGNOSIS — Z3482 Encounter for supervision of other normal pregnancy, second trimester: Secondary | ICD-10-CM

## 2018-05-27 LAB — POCT URINALYSIS DIPSTICK OB
Bilirubin, UA: NEGATIVE
Blood, UA: NEGATIVE
Glucose, UA: NEGATIVE
Ketones, UA: NEGATIVE
Leukocytes, UA: NEGATIVE
Nitrite, UA: NEGATIVE
POC,PROTEIN,UA: NEGATIVE
Spec Grav, UA: 1.02 (ref 1.010–1.025)
Urobilinogen, UA: 0.2 E.U./dL
pH, UA: 7 (ref 5.0–8.0)

## 2018-05-27 MED ORDER — SERTRALINE HCL 50 MG PO TABS: 50.0000 mg | ORAL_TABLET | Freq: Every day | ORAL | 0 refills | Status: DC

## 2018-05-27 MED ORDER — HYDROXYZINE HCL 25 MG PO TABS: 25.0000 mg | ORAL_TABLET | Freq: Four times a day (QID) | ORAL | 2 refills | Status: DC | PRN

## 2018-05-27 NOTE — Progress Notes (Signed)
ROB-Reports increased anxiety due to recent death of two family members. Wishes to restart Zoloft, took medication during last pregnancy. Rx Zoloft and Vistaril, see orders. Anticipatory guidance regarding course of prenatal care. Reviewed red flag symptoms and when to call. RTC x 4 weeks for 28 week labs, follow up ultrasound, and ROB or sooner if needed.   Depression screen [facility] 2/9 05/27/2018 04/24/2015  Decreased Interest 0 0  Down, Depressed, Hopeless 0 0  PHQ - 2 Score 0 0  Altered sleeping 0 -  Tired, decreased energy 3 -  Change in appetite 0 -  Feeling bad or failure about yourself  0 -  Trouble concentrating 0 -  Moving slowly or fidgety/restless 0 -  Suicidal thoughts 0 -  PHQ-9 Score 3 -  Difficult doing work/chores Somewhat difficult -   GAD 7 : Generalized Anxiety Score 05/27/2018  Nervous, Anxious, on Edge 3  Control/stop worrying 3  Worry too much - different things 0  Trouble relaxing 0  Restless 3  Easily annoyed or irritable 0  Afraid - awful might happen 3  Total GAD 7 Score 12  Anxiety Difficulty Not difficult at all

## 2018-05-27 NOTE — Patient Instructions (Addendum)
Round Ligament Pain  The round ligament is a cord of muscle and tissue that helps support the uterus. It can become a source of pain during pregnancy if it becomes stretched or twisted as the baby grows. The pain usually begins in the second trimester (13-28 weeks) of pregnancy, and it can come and go until the baby is delivered. It is not a serious problem, and it does not cause harm to the baby. Round ligament pain is usually a short, sharp, and pinching pain, but it can also be a dull, lingering, and aching pain. The pain is felt in the lower side of the abdomen or in the groin. It usually starts deep in the groin and moves up to the outside of the hip area. The pain may occur when you:  Suddenly change position, such as quickly going from a sitting to standing position.  Roll over in bed.  Cough or sneeze.  Do physical activity. Follow these instructions at home:   Watch your condition for any changes.  When the pain starts, relax. Then try any of these methods to help with the pain: ? Sitting down. ? Flexing your knees up to your abdomen. ? Lying on your side with one pillow under your abdomen and another pillow between your legs. ? Sitting in a warm bath for 15-20 minutes or until the pain goes away.  Take over-the-counter and prescription medicines only as told by your health care provider.  Move slowly when you sit down or stand up.  Avoid long walks if they cause pain.  Stop or reduce your physical activities if they cause pain.  Keep all follow-up visits as told by your health care provider. This is important. Contact a health care provider if:  Your pain does not go away with treatment.  You feel pain in your back that you did not have before.  Your medicine is not helping. Get help right away if:  You have a fever or chills.  You develop uterine contractions.  You have vaginal bleeding.  You have nausea or vomiting.  You have diarrhea.  You have pain  when you urinate. Summary  Round ligament pain is felt in the lower abdomen or groin. It is usually a short, sharp, and pinching pain. It can also be a dull, lingering, and aching pain.  This pain usually begins in the second trimester (13-28 weeks). It occurs because the uterus is stretching with the growing baby, and it is not harmful to the baby.  You may notice the pain when you suddenly change position, when you cough or sneeze, or during physical activity.  Relaxing, flexing your knees to your abdomen, lying on one side, or taking a warm bath may help to get rid of the pain.  Get help from your health care provider if the pain does not go away or if you have vaginal bleeding, nausea, vomiting, diarrhea, or painful urination. This information is not intended to replace advice given to you by your health care provider. Make sure you discuss any questions you have with your health care provider. Document Released: 10/01/2007 Document Revised: 06/09/2017 Document Reviewed: 06/09/2017 Elsevier Interactive Patient Education  2019 [employer] Back Pain in Pregnancy Back pain during pregnancy is common. Back pain may be caused by several factors that are related to changes during your pregnancy. Follow these instructions at home: Managing pain, stiffness, and swelling      If directed, for sudden (acute) back pain, put ice on the  painful area. ? Put ice in a plastic bag. ? Place a towel between your skin and the bag. ? Leave the ice on for 20 minutes, 2-3 times per day.  If directed, apply heat to the affected area before you exercise. Use the heat source that your health care provider recommends, such as a moist heat pack or a heating pad. ? Place a towel between your skin and the heat source. ? Leave the heat on for 20-30 minutes. ? Remove the heat if your skin turns bright red. This is especially important if you are unable to feel pain, heat, or cold. You may have a greater risk  of getting burned.  If directed, massage the affected area. Activity  Exercise as told by your health care provider. Gentle exercise is the best way to prevent or manage back pain.  Listen to your body when lifting. If lifting hurts, ask for help or bend your knees. This uses your leg muscles instead of your back muscles.  Squat down when picking up something from the floor. Do not bend over.  Only use bed rest for short periods as told by your health care provider. Bed rest should only be used for the most severe episodes of back pain. Standing, sitting, and lying down  Do not stand in one place for long periods of time.  Use good posture when sitting. Make sure your head rests over your shoulders and is not hanging forward. Use a pillow on your lower back if necessary.  Try sleeping on your side, preferably the left side, with a pregnancy support pillow or 1-2 regular pillows between your legs. ? If you have back pain after a night's rest, your bed may be too soft. ? A firm mattress may provide more support for your back during pregnancy. General instructions  Do not wear high heels.  Eat a healthy diet. Try to gain weight within your health care provider's recommendations.  Use a maternity girdle, elastic sling, or back brace as told by your health care provider.  Take over-the-counter and prescription medicines only as told by your health care provider.  Work with a physical therapist or massage therapist to find ways to manage back pain. Acupuncture or massage therapy may be helpful.  Keep all follow-up visits as told by your health care provider. This is important. Contact a health care provider if:  Your back pain interferes with your daily activities.  You have increasing pain in other parts of your body. Get help right away if:  You develop numbness, tingling, weakness, or problems with the use of your arms or legs.  You develop severe back pain that is not  controlled with medicine.  You have a change in bowel or bladder control.  You develop shortness of breath, dizziness, or you faint.  You develop nausea, vomiting, or sweating.  You have back pain that is a rhythmic, cramping pain similar to labor pains. Labor pain is usually 1-2 minutes apart, lasts for about 1 minute, and involves a bearing down feeling or pressure in your pelvis.  You have back pain and your water breaks or you have vaginal bleeding.  You have back pain or numbness that travels down your leg.  Your back pain developed after you fell.  You develop pain on one side of your back.  You see blood in your urine.  You develop skin blisters in the area of your back pain. Summary  Back pain may be caused by several  factors that are related to changes during your pregnancy.  Follow instructions as told by your health care provider for managing pain, stiffness, and swelling.  Exercise as told by your health care provider. Gentle exercise is the best way to prevent or manage back pain.  Take over-the-counter and prescription medicines only as told by your health care provider.  Keep all follow-up visits as told by your health care provider. This is important. This information is not intended to replace advice given to you by your health care provider. Make sure you discuss any questions you have with your health care provider. Document Released: 04/01/2005 Document Revised: 06/09/2017 Document Reviewed: 06/09/2017 Elsevier Interactive Patient Education  2019 [place] of Pregnancy  The second trimester is from week 14 through week 27 (month 4 through 6). This is often the time in pregnancy that you feel your best. Often times, morning sickness has lessened or quit. You may have more energy, and you may get hungry more often. Your unborn baby is growing rapidly. At the end of the sixth month, he or she is about 9 inches long and weighs about 1  pounds. You will likely feel the baby move between 18 and 20 weeks of pregnancy. Follow these instructions at home: Medicines  Take over-the-counter and prescription medicines only as told by your doctor. Some medicines are safe and some medicines are not safe during pregnancy.  Take a prenatal vitamin that contains at least 600 micrograms (mcg) of folic acid.  If you have trouble pooping (constipation), take medicine that will make your stool soft (stool softener) if your doctor approves. Eating and drinking   Eat regular, healthy meals.  Avoid raw meat and uncooked cheese.  If you get low calcium from the food you eat, talk to your doctor about taking a daily calcium supplement.  Avoid foods that are high in fat and sugars, such as fried and sweet foods.  If you feel sick to your stomach (nauseous) or throw up (vomit): ? Eat 4 or 5 small meals a day instead of 3 large meals. ? Try eating a few soda crackers. ? Drink liquids between meals instead of during meals.  To prevent constipation: ? Eat foods that are high in fiber, like fresh fruits and vegetables, whole grains, and beans. ? Drink enough fluids to keep your pee (urine) clear or pale yellow. Activity  Exercise only as told by your doctor. Stop exercising if you start to have cramps.  Do not exercise if it is too hot, too humid, or if you are in a place of great height (high altitude).  Avoid heavy lifting.  Wear low-heeled shoes. Sit and stand up straight.  You can continue to have sex unless your doctor tells you not to. Relieving pain and discomfort  Wear a good support bra if your breasts are tender.  Take warm water baths (sitz baths) to soothe pain or discomfort caused by hemorrhoids. Use hemorrhoid cream if your doctor approves.  Rest with your legs raised if you have leg cramps or low back pain.  If you develop puffy, bulging veins (varicose veins) in your legs: ? Wear support hose or compression  stockings as told by your doctor. ? Raise (elevate) your feet for 15 minutes, 3-4 times a day. ? Limit salt in your food. Prenatal care  Write down your questions. Take them to your prenatal visits.  Keep all your prenatal visits as told by your doctor. This is important. Safety  Wear your seat belt when driving.  Make a list of emergency phone numbers, including numbers for family, friends, the hospital, and police and fire departments. General instructions  Ask your doctor about the right foods to eat or for help finding a counselor, if you need these services.  Ask your doctor about local prenatal classes. Begin classes before month 6 of your pregnancy.  Do not use hot tubs, steam rooms, or saunas.  Do not douche or use tampons or scented sanitary pads.  Do not cross your legs for long periods of time.  Visit your dentist if you have not done so. Use a soft toothbrush to brush your teeth. Floss gently.  Avoid all smoking, herbs, and alcohol. Avoid drugs that are not approved by your doctor.  Do not use any products that contain nicotine or tobacco, such as cigarettes and e-cigarettes. If you need help quitting, ask your doctor.  Avoid cat litter boxes and soil used by cats. These carry germs that can cause birth defects in the baby and can cause a loss of your baby (miscarriage) or stillbirth. Contact a doctor if:  You have mild cramps or pressure in your lower belly.  You have pain when you pee (urinate).  You have bad smelling fluid coming from your vagina.  You continue to feel sick to your stomach (nauseous), throw up (vomit), or have watery poop (diarrhea).  You have a nagging pain in your belly area.  You feel dizzy. Get help right away if:  You have a fever.  You are leaking fluid from your vagina.  You have spotting or bleeding from your vagina.  You have severe belly cramping or pain.  You lose or gain weight rapidly.  You have trouble catching  your breath and have chest pain.  You notice sudden or extreme puffiness (swelling) of your face, hands, ankles, feet, or legs.  You have not felt the baby move in over an hour.  You have severe headaches that do not go away when you take medicine.  You have trouble seeing. Summary  The second trimester is from week 14 through week 27 (months 4 through 6). This is often the time in pregnancy that you feel your best.  To take care of yourself and your unborn baby, you will need to eat healthy meals, take medicines only if your doctor tells you to do so, and do activities that are safe for you and your baby.  Call your doctor if you get sick or if you notice anything unusual about your pregnancy. Also, call your doctor if you need help with the right food to eat, or if you want to know what activities are safe for you. This information is not intended to replace advice given to you by your health care provider. Make sure you discuss any questions you have with your health care provider. Document Released: 03/18/2009 Document Revised: 01/28/2016 Document Reviewed: 01/28/2016 Elsevier Interactive Patient Education  2019 [employer] Sertraline tablets What is this medicine? SERTRALINE (SER tra leen) is used to treat depression. It may also be used to treat obsessive compulsive disorder, panic disorder, post-trauma stress, premenstrual dysphoric disorder (PMDD) or social anxiety. This medicine may be used for other purposes; ask your health care provider or pharmacist if you have questions. COMMON BRAND NAME(S): Zoloft What should I tell my health care provider before I take this medicine? They need to know if you have any of these conditions: -bleeding disorders -bipolar disorder or a  family history of bipolar disorder -glaucoma -heart disease -high blood pressure -history of irregular heartbeat -history of low levels of calcium, magnesium, or potassium in the blood -if you often drink  alcohol -liver disease -receiving electroconvulsive therapy -seizures -suicidal thoughts, plans, or attempt; a previous suicide attempt by you or a family member -take medicines that treat or prevent blood clots -thyroid disease -an unusual or allergic reaction to sertraline, other medicines, foods, dyes, or preservatives -pregnant or trying to get pregnant -breast-feeding How should I use this medicine? Take this medicine by mouth with a glass of water. Follow the directions on the prescription label. You can take it with or without food. Take your medicine at regular intervals. Do not take your medicine more often than directed. Do not stop taking this medicine suddenly except upon the advice of your doctor. Stopping this medicine too quickly may cause serious side effects or your condition may worsen. A special MedGuide will be given to you by the pharmacist with each prescription and refill. Be sure to read this information carefully each time. Talk to your pediatrician regarding the use of this medicine in children. While this drug may be prescribed for children as young as 7 years for selected conditions, precautions do apply. Overdosage: If you think you have taken too much of this medicine contact a poison control center or emergency room at once. NOTE: This medicine is only for you. Do not share this medicine with others. What if I miss a dose? If you miss a dose, take it as soon as you can. If it is almost time for your next dose, take only that dose. Do not take double or extra doses. What may interact with this medicine? Do not take this medicine with any of the following medications: -cisapride -dofetilide -dronedarone -linezolid -MAOIs like Carbex, Eldepryl, Marplan, Nardil, and Parnate -methylene blue (injected into a vein) -pimozide -thioridazine This medicine may also interact with the following medications: -alcohol -amphetamines -aspirin and aspirin-like  medicines -certain medicines for depression, anxiety, or psychotic disturbances -certain medicines for fungal infections like ketoconazole, fluconazole, posaconazole, and itraconazole -certain medicines for irregular heart beat like flecainide, quinidine, propafenone -certain medicines for migraine headaches like almotriptan, eletriptan, frovatriptan, naratriptan, rizatriptan, sumatriptan, zolmitriptan -certain medicines for sleep -certain medicines for seizures like carbamazepine, valproic acid, phenytoin -certain medicines that treat or prevent blood clots like warfarin, enoxaparin, dalteparin -cimetidine -digoxin -diuretics -fentanyl -isoniazid -lithium -NSAIDs, medicines for pain and inflammation, like ibuprofen or naproxen -other medicines that prolong the QT interval (cause an abnormal heart rhythm) -rasagiline -safinamide -supplements like St. John's wort, kava kava, valerian -tolbutamide -tramadol -tryptophan This list may not describe all possible interactions. Give your health care provider a list of all the medicines, herbs, non-prescription drugs, or dietary supplements you use. Also tell them if you smoke, drink alcohol, or use illegal drugs. Some items may interact with your medicine. What should I watch for while using this medicine? Tell your doctor if your symptoms do not get better or if they get worse. Visit your doctor or health care professional for regular checks on your progress. Because it may take several weeks to see the full effects of this medicine, it is important to continue your treatment as prescribed by your doctor. Patients and their families should watch out for new or worsening thoughts of suicide or depression. Also watch out for sudden changes in feelings such as feeling anxious, agitated, panicky, irritable, hostile, aggressive, impulsive, severely restless, overly excited and hyperactive, or  not being able to sleep. If this happens, especially at the  beginning of treatment or after a change in dose, call your health care professional. Bonita Quin may get drowsy or dizzy. Do not drive, use machinery, or do anything that needs mental alertness until you know how this medicine affects you. Do not stand or sit up quickly, especially if you are an older patient. This reduces the risk of dizzy or fainting spells. Alcohol may interfere with the effect of this medicine. Avoid alcoholic drinks. Your mouth may get dry. Chewing sugarless gum or sucking hard candy, and drinking plenty of water may help. Contact your doctor if the problem does not go away or is severe. What side effects may I notice from receiving this medicine? Side effects that you should report to your doctor or health care professional as soon as possible: -allergic reactions like skin rash, itching or hives, swelling of the face, lips, or tongue -anxious -black, tarry stools -changes in vision -confusion -elevated mood, decreased need for sleep, racing thoughts, impulsive behavior -eye pain -fast, irregular heartbeat -feeling faint or lightheaded, falls -feeling agitated, angry, or irritable -hallucination, loss of contact with reality -loss of balance or coordination -loss of memory -painful or prolonged erections -restlessness, pacing, inability to keep still -seizures -stiff muscles -suicidal thoughts or other mood changes -trouble sleeping -unusual bleeding or bruising -unusually weak or tired -vomiting Side effects that usually do not require medical attention (report to your doctor or health care professional if they continue or are bothersome): -change in appetite or weight -change in sex drive or performance -diarrhea -increased sweating -indigestion, nausea -tremors This list may not describe all possible side effects. Call your doctor for medical advice about side effects. You may report side effects to FDA at 1-800-FDA-1088. Where should I keep my medicine? Keep  out of the reach of children. Store at room temperature between 15 and 30 degrees C (59 and 86 degrees F). Throw away any unused medicine after the expiration date. NOTE: This sheet is a summary. It may not cover all possible information. If you have questions about this medicine, talk to your doctor, pharmacist, or health care provider.  2019 [employer] (2015-12-27 14:17:49) Hydroxyzine capsules or tablets What is this medicine? HYDROXYZINE (hye DROX i zeen) is an antihistamine. This medicine is used to treat allergy symptoms. It is also used to treat anxiety and tension. This medicine can be used with other medicines to induce sleep before surgery. This medicine may be used for other purposes; ask your health care provider or pharmacist if you have questions. COMMON BRAND NAME(S): ANX, Atarax, Rezine, Vistaril What should I tell my health care provider before I take this medicine? They need to know if you have any of these conditions: -glaucoma -heart disease -history of irregular heartbeat -kidney disease -liver disease -lung or breathing disease, like asthma -stomach or intestine problems -thyroid disease -trouble passing urine -an unusual or allergic reaction to hydroxyzine, cetirizine, other medicines, foods, dyes or preservatives -pregnant or trying to get pregnant -breast-feeding How should I use this medicine? Take this medicine by mouth with a full glass of water. Follow the directions on the prescription label. You may take this medicine with food or on an empty stomach. Take your medicine at regular intervals. Do not take your medicine more often than directed. Talk to your pediatrician regarding the use of this medicine in children. Special care may be needed. While this drug may be prescribed for children as young as  51 years of age for selected conditions, precautions do apply. Patients over 58 years old may have a stronger reaction and need a smaller  dose. Overdosage: If you think you have taken too much of this medicine contact a poison control center or emergency room at once. NOTE: This medicine is only for you. Do not share this medicine with others. What if I miss a dose? If you miss a dose, take it as soon as you can. If it is almost time for your next dose, take only that dose. Do not take double or extra doses. What may interact with this medicine? Do not take this medicine with any of the following medications: -cisapride -dofetilide -dronedarone -pimozide -thioridazine This medicine may also interact with the following medications: -alcohol -antihistamines for allergy, cough, and cold -atropine -barbiturate medicines for sleep or seizures, like phenobarbital -certain antibiotics like erythromycin or clarithromycin -certain medicines for anxiety or sleep -certain medicines for bladder problems like oxybutynin, tolterodine -certain medicines for depression or psychotic disturbances -certain medicines for irregular heart beat -certain medicines for Parkinson's disease like benztropine, trihexyphenidyl -certain medicines for seizures like phenobarbital, primidone -certain medicines for stomach problems like dicyclomine, hyoscyamine -certain medicines for travel sickness like scopolamine -ipratropium -narcotic medicines for pain -other medicines that prolong the QT interval (an abnormal heart rhythm) This list may not describe all possible interactions. Give your health care provider a list of all the medicines, herbs, non-prescription drugs, or dietary supplements you use. Also tell them if you smoke, drink alcohol, or use illegal drugs. Some items may interact with your medicine. What should I watch for while using this medicine? Tell your doctor or health care professional if your symptoms do not improve. You may get drowsy or dizzy. Do not drive, use machinery, or do anything that needs mental alertness until you know how  this medicine affects you. Do not stand or sit up quickly, especially if you are an older patient. This reduces the risk of dizzy or fainting spells. Alcohol may interfere with the effect of this medicine. Avoid alcoholic drinks. Your mouth may get dry. Chewing sugarless gum or sucking hard candy, and drinking plenty of water may help. Contact your doctor if the problem does not go away or is severe. This medicine may cause dry eyes and blurred vision. If you wear contact lenses you may feel some discomfort. Lubricating drops may help. See your eye doctor if the problem does not go away or is severe. If you are receiving skin tests for allergies, tell your doctor you are using this medicine. What side effects may I notice from receiving this medicine? Side effects that you should report to your doctor or health care professional as soon as possible: -allergic reactions like skin rash, itching or hives, swelling of the face, lips, or tongue -changes in vision -confusion -fast, irregular heartbeat -seizures -tremor -trouble passing urine or change in the amount of urine Side effects that usually do not require medical attention (report to your doctor or health care professional if they continue or are bothersome): -constipation -drowsiness -dry mouth -headache -tiredness This list may not describe all possible side effects. Call your doctor for medical advice about side effects. You may report side effects to FDA at 1-800-FDA-1088. Where should I keep my medicine? Keep out of the reach of children. Store at room temperature between 15 and 30 degrees C (59 and 86 degrees F). Keep container tightly closed. Throw away any unused medicine after the expiration date. NOTE:  This sheet is a summary. It may not cover all possible information. If you have questions about this medicine, talk to your doctor, pharmacist, or health care provider.  2019 Elsevier/Gold Standard (2017-07-05 13:25:13)

## 2018-06-28 ENCOUNTER — Telehealth: Payer: Self-pay | Admitting: Certified Nurse Midwife

## 2018-06-28 ENCOUNTER — Telehealth: Payer: Self-pay

## 2018-06-28 NOTE — Telephone Encounter (Signed)
Attempted to contact patient for prescreening however the phone number listed is not in service.

## 2018-06-28 NOTE — Telephone Encounter (Signed)
The patient called and stated that she missed a call to do her prescreening before tomorrow's visit. Please advise.

## 2018-06-29 ENCOUNTER — Encounter: Payer: Self-pay | Admitting: Certified Nurse Midwife

## 2018-06-29 ENCOUNTER — Ambulatory Visit (INDEPENDENT_AMBULATORY_CARE_PROVIDER_SITE_OTHER): Payer: Medicaid Other | Admitting: Certified Nurse Midwife

## 2018-06-29 ENCOUNTER — Other Ambulatory Visit: Payer: Medicaid Other

## 2018-06-29 ENCOUNTER — Ambulatory Visit (INDEPENDENT_AMBULATORY_CARE_PROVIDER_SITE_OTHER): Payer: Medicaid Other

## 2018-06-29 ENCOUNTER — Other Ambulatory Visit: Payer: Self-pay

## 2018-06-29 VITALS — BP 107/71 | HR 112 | Wt 190.4 lb

## 2018-06-29 DIAGNOSIS — Z3482 Encounter for supervision of other normal pregnancy, second trimester: Secondary | ICD-10-CM | POA: Diagnosis not present

## 2018-06-29 DIAGNOSIS — O4442 Low lying placenta NOS or without hemorrhage, second trimester: Secondary | ICD-10-CM | POA: Diagnosis not present

## 2018-06-29 DIAGNOSIS — Z23 Encounter for immunization: Secondary | ICD-10-CM | POA: Diagnosis not present

## 2018-06-29 DIAGNOSIS — O4440 Low lying placenta NOS or without hemorrhage, unspecified trimester: Secondary | ICD-10-CM | POA: Diagnosis not present

## 2018-06-29 DIAGNOSIS — Z3483 Encounter for supervision of other normal pregnancy, third trimester: Secondary | ICD-10-CM

## 2018-06-29 LAB — POCT URINALYSIS DIPSTICK OB
Bilirubin, UA: NEGATIVE
Blood, UA: NEGATIVE
Glucose, UA: NEGATIVE
Ketones, UA: NEGATIVE
Leukocytes, UA: NEGATIVE
Nitrite, UA: NEGATIVE
POC,PROTEIN,UA: NEGATIVE
Spec Grav, UA: 1.01
Urobilinogen, UA: 0.2 U/dL
pH, UA: 5

## 2018-06-29 MED ORDER — TETANUS-DIPHTH-ACELL PERTUSSIS 5-2.5-18.5 LF-MCG/0.5 IM SUSP
0.5000 mL | Freq: Once | INTRAMUSCULAR | Status: AC
  Administered 2018-06-29: 0.5 mL via INTRAMUSCULAR

## 2018-06-29 NOTE — Addendum Note (Signed)
Addended by: Raliegh Ip on: 06/29/2018 02:42 PM   Modules accepted: Orders

## 2018-06-29 NOTE — Progress Notes (Signed)
ROB doing well. Follow up u/s today for placenta. LLP resolved. TDap/BTC/RPR/CBC/glucose test today. Discussed BC options after delivery, booklet given. Sample birth plan given. Follow up 2 wks with Melody .   Philip Aspen, CNM     Patient Name: BUELAH RENNIE DOB: Sep 07, 1995 MRN: [id number] ULTRASOUND REPORT  Location: [facility] Date of Service: 06/29/2018   Indications: Anatomy follow up ultrasound Findings:  Nelda Marseille intrauterine pregnancy is visualized with FHR at 143 BPM.  Fetal presentation is Cephalic.   Placenta: anterior. Grade: 1. Placenta has move away from internal cervical os.  AFI: subjectively normal.  Anatomic survey is complete.   There is no free peritoneal fluid in the cul de sac.  Impression: 1. [redacted]w[redacted]d Viable Singleton Intrauterine pregnancy previously established criteria. 2. Normal Anatomy Scan is now complete  3.   Recommendations: 1.Clinical correlation with the patient's History and Physical Exam.   Jenine M. Albertine Grates    RDMS

## 2018-06-29 NOTE — Patient Instructions (Signed)
Td Vaccine (Tetanus and Diphtheria): What You Need to Know 1. Why get vaccinated? Tetanus  and diphtheria are very serious diseases. They are rare in the [place] today, but people who do become infected often have severe complications. Td vaccine is used to protect adolescents and adults from both of these diseases. Both tetanus and diphtheria are infections caused by bacteria. Diphtheria spreads from person to person through coughing or sneezing. Tetanus-causing bacteria enter the body through cuts, scratches, or wounds. TETANUS (Lockjaw) causes painful muscle tightening and stiffness, usually all over the body.  It can lead to tightening of muscles in the head and neck so you can't open your mouth, swallow, or sometimes even breathe. Tetanus kills about 1 out of every 10 people who are infected even after receiving the best medical care. DIPHTHERIA can cause a thick coating to form in the back of the throat.  It can lead to breathing problems, paralysis, heart failure, and death. Before vaccines, as many as 200,000 cases of diphtheria and hundreds of cases of tetanus were reported in the [place] each year. Since vaccination began, reports of cases for both diseases have dropped by about 99%. 2. Td vaccine Td vaccine can protect adolescents and adults from tetanus and diphtheria. Td is usually given as a booster dose every 10 years but it can also be given earlier after a severe and dirty wound or burn. Another vaccine, called Tdap, which protects against pertussis in addition to tetanus and diphtheria, is sometimes recommended instead of Td vaccine. Your doctor or the person giving you the vaccine can give you more information. Td may safely be given at the same time as other vaccines. 3. Some people should not get this vaccine  A person who has ever had a life-threatening allergic reaction after a previous dose of any tetanus or diphtheria containing vaccine, OR has a severe allergy  to any part of this vaccine, should not get Td vaccine. Tell the person giving the vaccine about any severe allergies.  Talk to your doctor if you: ? had severe pain or swelling after any vaccine containing diphtheria or tetanus, ? ever had a condition called Guillain Barr Syndrome (GBS), ? aren't feeling well on the day the shot is scheduled. 4. Risks of a vaccine reaction With any medicine, including vaccines, there is a chance of side effects. These are usually mild and go away on their own. Serious reactions are also possible but are rare. Most people who get Td vaccine do not have any problems with it. Mild Problems following Td vaccine: (Did not interfere with activities)  Pain where the shot was given (about 8 people in 10)  Redness or swelling where the shot was given (about 1 person in 4)  Mild fever (rare)  Headache (about 1 person in 4)  Tiredness (about 1 person in 4) Moderate Problems following Td vaccine: (Interfered with activities, but did not require medical attention)  Fever over 102F (rare) Severe Problems following Td vaccine: (Unable to perform usual activities; required medical attention)  Swelling, severe pain, bleeding and/or redness in the arm where the shot was given (rare). Problems that could happen after any vaccine:  People sometimes faint after a medical procedure, including vaccination. Sitting or lying down for about 15 minutes can help prevent fainting, and injuries caused by a fall. Tell your doctor if you feel dizzy, or have vision changes or ringing in the ears.  Some people get severe pain in the shoulder and have   difficulty moving the arm where a shot was given. This happens very rarely.  Any medication can cause a severe allergic reaction. Such reactions from a vaccine are very rare, estimated at fewer than 1 in a million doses, and would happen within a few minutes to a few hours after the vaccination. As with any medicine, there is a  very remote chance of a vaccine causing a serious injury or death. The safety of vaccines is always being monitored. For more information, visit: www.cdc.gov/vaccinesafety/ 5. What if there is a serious reaction? What should I look for?  Look for anything that concerns you, such as signs of a severe allergic reaction, very high fever, or unusual behavior. Signs of a severe allergic reaction can include hives, swelling of the face and throat, difficulty breathing, a fast heartbeat, dizziness, and weakness. These would usually start a few minutes to a few hours after the vaccination. What should I do?  If you think it is a severe allergic reaction or other emergency that can't wait, call 9-1-1 or get the person to the nearest hospital. Otherwise, call your doctor.  Afterward, the reaction should be reported to the Vaccine Adverse Event Reporting System (VAERS). Your doctor might file this report, or you can do it yourself through the VAERS web site at www.vaers.hhs.gov, or by calling 1-800-822-7967. VAERS does not give medical advice. 6. The National Vaccine Injury Compensation Program The National Vaccine Injury Compensation Program (VICP) is a federal program that was created to compensate people who may have been injured by certain vaccines. Persons who believe they may have been injured by a vaccine can learn about the program and about filing a claim by calling 1-800-338-2382 or visiting the VICP website at www.hrsa.gov/vaccinecompensation. There is a time limit to file a claim for compensation. 7. How can I learn more?  Ask your doctor. He or she can give you the vaccine package insert or suggest other sources of information.  Call your local or state health department.  Contact the Centers for Disease Control and Prevention (CDC): ? Call 1-800-232-4636 (1-800-CDC-INFO) ? Visit CDC's website at www.cdc.gov/vaccines Vaccine Information Statement Td Vaccine (04/16/15) This information is  not intended to replace advice given to you by your health care provider. Make sure you discuss any questions you have with your health care provider. Document Released: 10/19/2005 Document Revised: 08/09/2017 Document Reviewed: 08/09/2017 Elsevier Interactive Patient Education  2019 Elsevier Inc.  

## 2018-06-30 LAB — CBC
Hematocrit: 33.9 % — ABNORMAL LOW (ref 34.0–46.6)
Hemoglobin: 11.6 g/dL (ref 11.1–15.9)
MCH: 29.4 pg (ref 26.6–33.0)
MCHC: 34.2 g/dL (ref 31.5–35.7)
MCV: 86 fL (ref 79–97)
Platelets: 228 10*3/uL (ref 150–450)
RBC: 3.95 x10E6/uL (ref 3.77–5.28)
RDW: 12.3 % (ref 11.7–15.4)
WBC: 9 10*3/uL (ref 3.4–10.8)

## 2018-06-30 LAB — GLUCOSE, 1 HOUR GESTATIONAL: Gestational Diabetes Screen: 121 mg/dL (ref 65–139)

## 2018-06-30 LAB — RPR: RPR Ser Ql: NONREACTIVE

## 2018-07-15 ENCOUNTER — Other Ambulatory Visit: Payer: Self-pay

## 2018-07-15 ENCOUNTER — Ambulatory Visit (INDEPENDENT_AMBULATORY_CARE_PROVIDER_SITE_OTHER): Payer: Medicaid Other | Admitting: Obstetrics and Gynecology

## 2018-07-15 VITALS — BP 126/80 | HR 95 | Wt 190.3 lb

## 2018-07-15 DIAGNOSIS — F419 Anxiety disorder, unspecified: Secondary | ICD-10-CM

## 2018-07-15 DIAGNOSIS — O9989 Other specified diseases and conditions complicating pregnancy, childbirth and the puerperium: Secondary | ICD-10-CM

## 2018-07-15 DIAGNOSIS — Z3483 Encounter for supervision of other normal pregnancy, third trimester: Secondary | ICD-10-CM

## 2018-07-15 DIAGNOSIS — K219 Gastro-esophageal reflux disease without esophagitis: Secondary | ICD-10-CM

## 2018-07-15 LAB — POCT URINALYSIS DIPSTICK OB
Bilirubin, UA: NEGATIVE
Blood, UA: NEGATIVE
Glucose, UA: NEGATIVE
Ketones, UA: NEGATIVE
Nitrite, UA: NEGATIVE
Odor: NEGATIVE
POC,PROTEIN,UA: NEGATIVE
Spec Grav, UA: <=1.005 — AB (ref 1.010–1.025)
Urobilinogen, UA: 0.2 E.U./dL
pH, UA: 7.5 (ref 5.0–8.0)

## 2018-07-15 MED ORDER — OMEPRAZOLE 40 MG PO CPDR: 40.0000 mg | DELAYED_RELEASE_CAPSULE | Freq: Two times a day (BID) | ORAL | 4 refills | Status: DC

## 2018-07-15 NOTE — Patient Instructions (Signed)
Heartburn During Pregnancy °Heartburn is a type of pain or discomfort that can happen in the throat or chest. It is often described as a burning sensation. Heartburn is common during pregnancy because: °· A hormone (progesterone) that is released during pregnancy may relax the valve (lower esophageal sphincter, or LES) that separates the esophagus from the stomach. This allows stomach acid to move up into the esophagus, causing heartburn. °· The uterus gets larger and pushes up on the stomach, which pushes more acid into the esophagus. This is especially true in the later stages of pregnancy. °Heartburn usually goes away or gets better after giving birth. °What are the causes? °Heartburn is caused by stomach acid backing up into the esophagus (reflux). Reflux can be triggered by: °· Changing hormone levels. °· Large meals. °· Certain foods and beverages, such as coffee, chocolate, onions, and peppermint. °· Exercise. °· Increased stomach acid production. °What increases the risk? °You are more likely to experience heartburn during pregnancy if you: °· Had heartburn prior to becoming pregnant. °· Have been pregnant more than once before. °· Are overweight or obese. °The likelihood that you will get heartburn also increases as you get farther along in your pregnancy, especially during the last trimester. °What are the signs or symptoms? °Symptoms of this condition include: °· Burning pain in the chest or lower throat. °· Bitter taste in the mouth. °· Coughing. °· Problems swallowing. °· Vomiting. °· Hoarse voice. °· Asthma. °Symptoms may get worse when you lie down or bend over. Symptoms are often worse at night. °How is this diagnosed? °This condition is diagnosed based on: °· Your medical history. °· Your symptoms. °· Blood tests to check for a certain type of bacteria associated with heartburn. °· Whether taking heartburn medicine relieves your symptoms. °· Examination of the stomach and esophagus using a tube with  a light and camera on the end (endoscopy). °How is this treated? °Treatment varies depending on how severe your symptoms are. Your health care provider may recommend: °· Over-the-counter medicines (antacids or acid reducers) for mild heartburn. °· Prescription medicines to decrease stomach acid or to protect your stomach lining. °· Certain changes in your diet. °· Raising the head of your bed so it is higher than the foot of the bed. This helps prevent stomach acid from backing up into the esophagus when you are lying down. °Follow these instructions at home: °Eating and drinking °· Do not drink alcohol during your pregnancy. °· Identify foods and beverages that make your symptoms worse, and avoid them. °· Beverages that you may want to avoid include: °? Coffee and tea (with or without caffeine). °? Energy drinks and sports drinks. °? Carbonated drinks or sodas. °? Citrus fruit juices. °· Foods that you may want to avoid include: °? Chocolate and cocoa. °? Peppermint and mint flavorings. °? Garlic, onions, and horseradish. °? Spicy and acidic foods, including peppers, chili powder, curry powder, vinegar, hot sauces, and barbecue sauce. °? Citrus fruits, such as oranges, lemons, and limes. °? Tomato-based foods, such as red sauce, chili, and salsa. °? Fried and fatty foods, such as donuts, french fries, potato chips, and high-fat dressings. °? High-fat meats, such as hot dogs, cold cuts, sausage, ham, and bacon. °? High-fat dairy items, such as whole milk, butter, and cheese. °· Eat small, frequent meals instead of large meals. °· Avoid drinking large amounts of liquid with your meals. °· Avoid eating meals during the 2-3 hours before bedtime. °· Avoid lying down right   after you eat. °· Do not exercise right after you eat. °Medicines °· Take over-the-counter and prescription medicines only as told by your health care provider. °· Do not take aspirin, ibuprofen, or other NSAIDs unless your health care provider tells  you to do that. °· You may be instructed to avoid medicines that contain sodium bicarbonate. °General instructions ° °· If directed, raise the head of your bed about 6 inches (15 cm) by putting blocks under the legs. Sleeping with more pillows does not effectively relieve heartburn because it only changes the position of your head. °· Do not use any products that contain nicotine or tobacco, such as cigarettes and e-cigarettes. If you need help quitting, ask your health care provider. °· Wear loose-fitting clothing. °· Try to reduce your stress, such as with yoga or meditation. If you need help managing stress, ask your health care provider. °· Maintain a healthy weight. If you are overweight, work with your health care provider to safely lose weight. °· Keep all follow-up visits as told by your health care provider. This is important. °Contact a health care provider if: °· You develop new symptoms. °· Your symptoms do not improve with treatment. °· You have unexplained weight loss. °· You have difficulty swallowing. °· You make loud sounds when you breathe (wheeze). °· You have a cough that does not go away. °· You have frequent heartburn for more than 2 weeks. °· You have nausea or vomiting that does not get better with treatment. °· You have pain in your abdomen. °Get help right away if: °· You have severe chest pain that spreads to your arm, neck, or jaw. °· You feel sweaty, dizzy, or light-headed. °· You have shortness of breath. °· You have pain when swallowing. °· You vomit, and your vomit looks like blood or coffee grounds. °· Your stool is bloody or black. °This information is not intended to replace advice given to you by your health care provider. Make sure you discuss any questions you have with your health care provider. °Document Released: 12/20/1999 Document Revised: 03/22/2017 Document Reviewed: 09/09/2015 °Elsevier Patient Education © 2020 Elsevier Inc. ° °

## 2018-07-15 NOTE — Progress Notes (Signed)
ROB- really bad heartburn. + for cramps.

## 2018-07-15 NOTE — Progress Notes (Signed)
ROB- doing well, discussed heartburn in pregnancy

## 2018-07-15 NOTE — Addendum Note (Signed)
Addended by: Elouise Munroe on: 07/15/2018 11:53 AM   Modules accepted: Orders

## 2018-07-15 NOTE — Progress Notes (Signed)
Please send for culture

## 2018-07-17 LAB — URINE CULTURE

## 2018-07-29 ENCOUNTER — Ambulatory Visit (INDEPENDENT_AMBULATORY_CARE_PROVIDER_SITE_OTHER): Payer: Medicaid Other | Admitting: Certified Nurse Midwife

## 2018-07-29 ENCOUNTER — Other Ambulatory Visit: Payer: Self-pay

## 2018-07-29 VITALS — BP 117/77 | HR 104 | Wt 196.5 lb

## 2018-07-29 DIAGNOSIS — Z3493 Encounter for supervision of normal pregnancy, unspecified, third trimester: Secondary | ICD-10-CM

## 2018-07-29 DIAGNOSIS — O26893 Other specified pregnancy related conditions, third trimester: Secondary | ICD-10-CM

## 2018-07-29 DIAGNOSIS — R102 Pelvic and perineal pain: Secondary | ICD-10-CM

## 2018-07-29 DIAGNOSIS — O99343 Other mental disorders complicating pregnancy, third trimester: Secondary | ICD-10-CM

## 2018-07-29 DIAGNOSIS — F419 Anxiety disorder, unspecified: Secondary | ICD-10-CM

## 2018-07-29 LAB — POCT URINALYSIS DIPSTICK OB
Bilirubin, UA: NEGATIVE
Blood, UA: NEGATIVE
Glucose, UA: NEGATIVE
Ketones, UA: NEGATIVE
Nitrite, UA: NEGATIVE
Spec Grav, UA: 1.015 (ref 1.010–1.025)
Urobilinogen, UA: 0.2 E.U./dL
pH, UA: 7 (ref 5.0–8.0)

## 2018-07-29 NOTE — Patient Instructions (Signed)

## 2018-07-29 NOTE — Progress Notes (Signed)
ROB-Reports increased pelvic pressure. Discussed home treatment measures including abdominal support. Third trimester handouts provided. Encouraged perineal stretching with mineral oil at 34 weeks. Anticipatory guidance regarding course of prenatal care. Reviewed red flag symptoms and when to call. RTC x 2 weeks for ROB or sooner if needed.    Depression screen [facility] 2/9 07/29/2018 05/27/2018 04/24/2015  Decreased Interest 0 0 0  Down, Depressed, Hopeless 0 0 0  PHQ - 2 Score 0 0 0  Altered sleeping 1 0 -  Tired, decreased energy 1 3 -  Change in appetite 0 0 -  Feeling bad or failure about yourself  0 0 -  Trouble concentrating 0 0 -  Moving slowly or fidgety/restless 0 0 -  Suicidal thoughts 0 0 -  PHQ-9 Score 2 3 -  Difficult doing work/chores Not difficult at all Somewhat difficult -   GAD 7 : Generalized Anxiety Score 07/29/2018 05/27/2018  Nervous, Anxious, on Edge 0 3  Control/stop worrying 1 3  Worry too much - different things 0 0  Trouble relaxing 1 0  Restless 1 3  Easily annoyed or irritable 1 0  Afraid - awful might happen 0 3  Total GAD 7 Score 4 12  Anxiety Difficulty Not difficult at all Not difficult at all

## 2018-07-29 NOTE — Progress Notes (Signed)
ROB-Patient c/o constant pelvic pressure x1 week.

## 2018-08-12 ENCOUNTER — Encounter: Payer: Medicaid Other | Admitting: Certified Nurse Midwife

## 2018-08-16 ENCOUNTER — Encounter: Payer: Self-pay | Admitting: Certified Nurse Midwife

## 2018-08-25 ENCOUNTER — Observation Stay
Admission: EM | Admit: 2018-08-25 | Discharge: 2018-08-25 | Disposition: A | Discharge status: Home / Self Care | Payer: Medicaid Other | Source: Home / Self Care | Admitting: Obstetrics and Gynecology

## 2018-08-25 ENCOUNTER — Other Ambulatory Visit: Payer: Self-pay

## 2018-08-25 DIAGNOSIS — F419 Anxiety disorder, unspecified: Secondary | ICD-10-CM | POA: Insufficient documentation

## 2018-08-25 DIAGNOSIS — O99343 Other mental disorders complicating pregnancy, third trimester: Secondary | ICD-10-CM | POA: Insufficient documentation

## 2018-08-25 DIAGNOSIS — Z87891 Personal history of nicotine dependence: Secondary | ICD-10-CM | POA: Insufficient documentation

## 2018-08-25 DIAGNOSIS — Z79899 Other long term (current) drug therapy: Secondary | ICD-10-CM | POA: Insufficient documentation

## 2018-08-25 DIAGNOSIS — O99513 Diseases of the respiratory system complicating pregnancy, third trimester: Secondary | ICD-10-CM | POA: Insufficient documentation

## 2018-08-25 DIAGNOSIS — R030 Elevated blood-pressure reading, without diagnosis of hypertension: Secondary | ICD-10-CM | POA: Insufficient documentation

## 2018-08-25 DIAGNOSIS — J45909 Unspecified asthma, uncomplicated: Secondary | ICD-10-CM | POA: Insufficient documentation

## 2018-08-25 DIAGNOSIS — E7800 Pure hypercholesterolemia, unspecified: Secondary | ICD-10-CM | POA: Insufficient documentation

## 2018-08-25 DIAGNOSIS — F329 Major depressive disorder, single episode, unspecified: Secondary | ICD-10-CM | POA: Insufficient documentation

## 2018-08-25 DIAGNOSIS — Z3A37 37 weeks gestation of pregnancy: Secondary | ICD-10-CM | POA: Insufficient documentation

## 2018-08-25 DIAGNOSIS — O99013 Anemia complicating pregnancy, third trimester: Secondary | ICD-10-CM | POA: Insufficient documentation

## 2018-08-25 DIAGNOSIS — O26893 Other specified pregnancy related conditions, third trimester: Secondary | ICD-10-CM | POA: Insufficient documentation

## 2018-08-25 DIAGNOSIS — E876 Hypokalemia: Secondary | ICD-10-CM | POA: Insufficient documentation

## 2018-08-25 DIAGNOSIS — F909 Attention-deficit hyperactivity disorder, unspecified type: Secondary | ICD-10-CM | POA: Insufficient documentation

## 2018-08-25 LAB — COMPREHENSIVE METABOLIC PANEL
ALT: 11 U/L (ref 0–44)
AST: 18 U/L (ref 15–41)
Albumin: 3 g/dL — ABNORMAL LOW (ref 3.5–5.0)
Alkaline Phosphatase: 110 U/L (ref 38–126)
Anion gap: 5 (ref 5–15)
BUN: <5 mg/dL — ABNORMAL LOW (ref 6–20)
CO2: 23 mmol/L (ref 22–32)
Calcium: 8.7 mg/dL — ABNORMAL LOW (ref 8.9–10.3)
Chloride: 109 mmol/L (ref 98–111)
Creatinine, Ser: 0.49 mg/dL (ref 0.44–1.00)
GFR calc Af Amer: >60 mL/min (ref >60)
GFR calc non Af Amer: >60 mL/min (ref >60)
Glucose, Bld: 96 mg/dL (ref 70–99)
Potassium: 2.7 mmol/L — CL (ref 3.5–5.1)
Sodium: 137 mmol/L (ref 135–145)
Total Bilirubin: 0.5 mg/dL (ref 0.3–1.2)
Total Protein: 5.9 g/dL — ABNORMAL LOW (ref 6.5–8.1)

## 2018-08-25 LAB — CBC
HCT: 30.2 % — ABNORMAL LOW (ref 36.0–46.0)
Hemoglobin: 10 g/dL — ABNORMAL LOW (ref 12.0–15.0)
MCH: 26.9 pg (ref 26.0–34.0)
MCHC: 33.1 g/dL (ref 30.0–36.0)
MCV: 81.2 fL (ref 80.0–100.0)
Platelets: 206 10*3/uL (ref 150–400)
RBC: 3.72 MIL/uL — ABNORMAL LOW (ref 3.87–5.11)
RDW: 13.1 % (ref 11.5–15.5)
WBC: 9.1 10*3/uL (ref 4.0–10.5)
nRBC: 0 % (ref 0.0–0.2)

## 2018-08-25 LAB — PROTEIN / CREATININE RATIO, URINE
Creatinine, Urine: 108 mg/dL
Protein Creatinine Ratio: 0.29 mg/mg{Cre} — ABNORMAL HIGH (ref 0.00–0.15)
Total Protein, Urine: 31 mg/dL

## 2018-08-25 MED ORDER — POTASSIUM CHLORIDE CRYS ER 10 MEQ PO TBCR
40.0000 meq | EXTENDED_RELEASE_TABLET | Freq: Two times a day (BID) | ORAL | Status: DC
  Administered 2018-08-25: 40 meq via ORAL
  Filled 2018-08-25: qty 4

## 2018-08-25 MED ORDER — POTASSIUM CHLORIDE CRYS ER 20 MEQ PO TBCR: 40.0000 meq | EXTENDED_RELEASE_TABLET | Freq: Two times a day (BID) | ORAL | 0 refills | Status: DC

## 2018-08-25 NOTE — Discharge Summary (Signed)
Madison Jenkins is a 23 y.o. female. She is at 4019w0d gestation. Patient's last menstrual period was 12/09/2017. Estimated Date of Delivery: 09/15/18  [facility]- recently transferred from [facility]  Current pregnancy complicated by:  1. Hx depression/anxiety 2. Transfer of care at 36.5wks 3. Elevated BP at home monitoring,   4. Anemia  Chief complaint: elevated BP at home today 168/70s  Location: home BP monitor Onset/timing: elevated BP off and on for about a week Duration: intermittent Quality: BP varying 140-160/70s Severity: n/a Aggravating or alleviating conditions: woke up with vomiting once this week.  Associated signs/symptoms: pts mother had Pre-eclampsia with her second baby.  Context: denies any sx Pre-eclampsia- no HA, VD or epigastric pain.   S: Resting comfortably. no CTX, no VB.no LOF,  Active fetal movement. Denies: HA, visual changes, SOB, or RUQ/epigastric pain  Maternal Medical History:   Past Medical History:  Diagnosis Date  . ADD (attention deficit disorder)    ADHD as per pt  . Asthma   . Depression   . Eczema   . Fatty liver   . Headache   . High cholesterol   . Murmur   . Orthostatic hypotension     Past Surgical History:  Procedure Laterality Date  . ADENOIDECTOMY    . TONSILLECTOMY AND ADENOIDECTOMY      Allergies  Allergen Reactions  . Peanut-Containing Drug Products   . Penicillins Anaphylaxis  . Shellfish Allergy Hives and Shortness Of Breath  . Sulfa Antibiotics Anaphylaxis  . Amoxicillin Rash    Prior to Admission medications   Medication Sig Start Date End Date Taking? Authorizing Provider  ferrous sulfate 325 (65 FE) MG tablet Take 325 mg by mouth daily with breakfast.   Yes [provider]  omeprazole (PRILOSEC) 40 MG capsule Take 1 capsule (40 mg total) by mouth 2 (two) times daily before a meal. 07/15/18  Yes Shambley, Melody N, CNM  Prenatal Vit-Fe Fumarate-FA (PRENATAL MULTIVITAMIN)  TABS tablet Take 1 tablet by mouth daily at 12 noon.   Yes [provider]  sertraline (ZOLOFT) 50 MG tablet Take 1 tablet (50 mg total) by mouth daily. Start with 25 mg (half tablet) daily for one (1) week then increase. Patient not taking: Reported on 08/25/2018 05/27/18   Gunnar BullaLawhorn, Jenkins Michelle, CNM      Social History: She  reports that she quit smoking about 3 years ago. She has a 0.25 pack-year smoking history. She has quit using smokeless tobacco. She reports previous alcohol use. She reports that she does not use drugs.  Family History: family history includes Breast cancer in her paternal grandmother; Cancer - Other in her mother; Luiz BlareGraves' disease in her mother; Lupus in her mother; Thyroid disease in her mother.   Review of Systems: A full review of systems was performed and negative except as noted in the HPI.     O:  BP (!) 156/79 (BP Location: Left Arm)   Pulse 82   Temp 98.5 F (36.9 C) (Oral)   Resp 16   Ht 5\' 8"  (1.727 m)   Wt 92.1 kg   LMP 12/09/2017   BMI 30.87 kg/m  Results for orders placed or performed during the hospital encounter of 08/25/18 (from the past 48 hour(s))  CBC on admission   Collection Time: 08/25/18  5:55 PM  Result Value Ref Range   WBC 9.1 4.0 - 10.5 K/uL   RBC 3.72 (L) 3.87 - 5.11 MIL/uL   Hemoglobin 10.0 (  L) 12.0 - 15.0 g/dL   HCT 30.2 (L) 36.0 - 46.0 %   MCV 81.2 80.0 - 100.0 fL   MCH 26.9 26.0 - 34.0 pg   MCHC 33.1 30.0 - 36.0 g/dL   RDW 13.1 11.5 - 15.5 %   Platelets 206 150 - 400 K/uL   nRBC 0.0 0.0 - 0.2 %  Comprehensive metabolic panel   Collection Time: 08/25/18  5:55 PM  Result Value Ref Range   Sodium 137 135 - 145 mmol/L   Potassium 2.7 (LL) 3.5 - 5.1 mmol/L   Chloride 109 98 - 111 mmol/L   CO2 23 22 - 32 mmol/L   Glucose, Bld 96 70 - 99 mg/dL   BUN <5 (L) 6 - 20 mg/dL   Creatinine, Ser 0.49 0.44 - 1.00 mg/dL   Calcium 8.7 (L) 8.9 - 10.3 mg/dL   Total Protein 5.9 (L) 6.5 - 8.1 g/dL   Albumin 3.0 (L) 3.5 -  5.0 g/dL   AST 18 15 - 41 U/L   ALT 11 0 - 44 U/L   Alkaline Phosphatase 110 38 - 126 U/L   Total Bilirubin 0.5 0.3 - 1.2 mg/dL   GFR calc non Af Amer >60 >60 mL/min   GFR calc Af Amer >60 >60 mL/min   Anion gap 5 5 - 15  Protein / creatinine ratio, urine   Collection Time: 08/25/18  6:18 PM  Result Value Ref Range   Creatinine, Urine 108 mg/dL   Total Protein, Urine 31 mg/dL   Protein Creatinine Ratio 0.29 (H) 0.00 - 0.15 mg/mg[Cre]     Constitutional: NAD, AAOx3  HE/ENT: extraocular movements grossly intact, moist mucous membranes CV: RRR PULM: nl respiratory effort, CTABL     Abd: gravid, non-tender, non-distended, soft      Ext: Non-tender, Nonedematous   Psych: mood appropriate, speech normal Pelvic: deferred  Fetal  monitoring: Cat I Appropriate for GA, reactive NST Baseline: 125bpm  Variability: moderate Accelerations: present x >2 Decelerations absent Time 13mins    A/P: 23 y.o. [redacted]w[redacted]d here for antenatal surveillance for elevated BP on home monitoring  Principle Diagnosis:   Gestational HTN without proteinuria or severe sx.  37wks Hypokalemia   Labor: not present.   Fetal Wellbeing: Reassuring Cat 1 tracin with reactive NST   Elevated BPs intermittently mild range without proteinuria or severe sx.   IOL scheduled for 38.3wks, reviewed precautions for worsening GHTN  Hypokalemia noted on labs today, asymptomatic, PO replacement ordered after consult with Dr Leafy Ro.  Kidney/liver function and platelets WNL, urine P/C 290. Will have pt complete 24hr urine for total protein.   D/c home stable, precautions reviewed, follow-up on Monday in office for repeat labs and NST    Francetta Found, CNM 08/25/2018  7:25 PM

## 2018-08-26 ENCOUNTER — Other Ambulatory Visit: Payer: Self-pay

## 2018-08-26 ENCOUNTER — Inpatient Hospital Stay
Admission: EM | Admit: 2018-08-26 | Discharge: 2018-08-29 | DRG: 806 | Disposition: A | Discharge status: Home / Self Care | Payer: Medicaid Other | Attending: Obstetrics & Gynecology | Admitting: Obstetrics & Gynecology

## 2018-08-26 DIAGNOSIS — R030 Elevated blood-pressure reading, without diagnosis of hypertension: Secondary | ICD-10-CM

## 2018-08-26 DIAGNOSIS — O134 Gestational [pregnancy-induced] hypertension without significant proteinuria, complicating childbirth: Principal | ICD-10-CM | POA: Diagnosis present

## 2018-08-26 DIAGNOSIS — O9081 Anemia of the puerperium: Secondary | ICD-10-CM | POA: Diagnosis not present

## 2018-08-26 DIAGNOSIS — Z3A37 37 weeks gestation of pregnancy: Secondary | ICD-10-CM

## 2018-08-26 DIAGNOSIS — Z20828 Contact with and (suspected) exposure to other viral communicable diseases: Secondary | ICD-10-CM | POA: Diagnosis present

## 2018-08-26 DIAGNOSIS — Z87891 Personal history of nicotine dependence: Secondary | ICD-10-CM | POA: Diagnosis not present

## 2018-08-26 DIAGNOSIS — D62 Acute posthemorrhagic anemia: Secondary | ICD-10-CM | POA: Diagnosis not present

## 2018-08-26 LAB — COMPREHENSIVE METABOLIC PANEL
ALT: 12 U/L (ref 0–44)
AST: 22 U/L (ref 15–41)
Albumin: 3 g/dL — ABNORMAL LOW (ref 3.5–5.0)
Alkaline Phosphatase: 113 U/L (ref 38–126)
Anion gap: 11 (ref 5–15)
BUN: <5 mg/dL — ABNORMAL LOW (ref 6–20)
CO2: 21 mmol/L — ABNORMAL LOW (ref 22–32)
Calcium: 8.6 mg/dL — ABNORMAL LOW (ref 8.9–10.3)
Chloride: 107 mmol/L (ref 98–111)
Creatinine, Ser: 0.5 mg/dL (ref 0.44–1.00)
GFR calc Af Amer: >60 mL/min (ref >60)
GFR calc non Af Amer: >60 mL/min (ref >60)
Glucose, Bld: 80 mg/dL (ref 70–99)
Potassium: 2.7 mmol/L — CL (ref 3.5–5.1)
Sodium: 139 mmol/L (ref 135–145)
Total Bilirubin: 0.6 mg/dL (ref 0.3–1.2)
Total Protein: 6.1 g/dL — ABNORMAL LOW (ref 6.5–8.1)

## 2018-08-26 LAB — SARS CORONAVIRUS 2 BY RT PCR (HOSPITAL ORDER, PERFORMED IN ~~LOC~~ HOSPITAL LAB): SARS Coronavirus 2: NEGATIVE

## 2018-08-26 LAB — CBC
HCT: 30.4 % — ABNORMAL LOW (ref 36.0–46.0)
Hemoglobin: 10.1 g/dL — ABNORMAL LOW (ref 12.0–15.0)
MCH: 27 pg (ref 26.0–34.0)
MCHC: 33.2 g/dL (ref 30.0–36.0)
MCV: 81.3 fL (ref 80.0–100.0)
Platelets: 211 10*3/uL (ref 150–400)
RBC: 3.74 MIL/uL — ABNORMAL LOW (ref 3.87–5.11)
RDW: 13 % (ref 11.5–15.5)
WBC: 9.6 10*3/uL (ref 4.0–10.5)
nRBC: 0 % (ref 0.0–0.2)

## 2018-08-26 LAB — CHLAMYDIA/NGC RT PCR (ARMC ONLY)
Chlamydia Tr: NOT DETECTED
N gonorrhoeae: NOT DETECTED

## 2018-08-26 LAB — PROTEIN / CREATININE RATIO, URINE
Creatinine, Urine: 221 mg/dL
Protein Creatinine Ratio: 0.28 mg/mg{Cre} — ABNORMAL HIGH (ref 0.00–0.15)
Total Protein, Urine: 61 mg/dL

## 2018-08-26 MED ORDER — SOD CITRATE-CITRIC ACID 500-334 MG/5ML PO SOLN: 30.0000 mL | ORAL | Status: DC | PRN

## 2018-08-26 MED ORDER — MISOPROSTOL 25 MCG QUARTER TABLET
25.0000 ug | ORAL_TABLET | ORAL | Status: DC | PRN
  Administered 2018-08-26: 19:00:00 25 ug via VAGINAL
  Filled 2018-08-26: qty 1

## 2018-08-26 MED ORDER — OXYTOCIN 40 UNITS IN NORMAL SALINE INFUSION - SIMPLE MED
INTRAVENOUS | Status: AC
  Administered 2018-08-27: 2 m[IU]/min via INTRAVENOUS
  Filled 2018-08-26: qty 1000

## 2018-08-26 MED ORDER — ONDANSETRON HCL 4 MG/2ML IJ SOLN
4.0000 mg | Freq: Four times a day (QID) | INTRAMUSCULAR | Status: DC | PRN
  Administered 2018-08-27: 4 mg via INTRAVENOUS
  Filled 2018-08-26: qty 2

## 2018-08-26 MED ORDER — BUTORPHANOL TARTRATE 1 MG/ML IJ SOLN
1.0000 mg | INTRAMUSCULAR | Status: DC | PRN
  Administered 2018-08-27 (×2): 1 mg via INTRAVENOUS
  Filled 2018-08-26 (×2): qty 1

## 2018-08-26 MED ORDER — OXYTOCIN BOLUS FROM INFUSION
500.0000 mL | Freq: Once | INTRAVENOUS | Status: AC
  Administered 2018-08-27: 500 mL via INTRAVENOUS

## 2018-08-26 MED ORDER — OXYTOCIN 40 UNITS IN NORMAL SALINE INFUSION - SIMPLE MED
2.5000 [IU]/h | INTRAVENOUS | Status: DC
  Administered 2018-08-27 (×2): 2.5 [IU]/h via INTRAVENOUS
  Filled 2018-08-26 (×2): qty 1000

## 2018-08-26 MED ORDER — LACTATED RINGERS IV SOLN
INTRAVENOUS | Status: DC
  Administered 2018-08-26: 999 mL via INTRAVENOUS
  Administered 2018-08-27 (×2): via INTRAVENOUS

## 2018-08-26 MED ORDER — LACTATED RINGERS IV SOLN
500.0000 mL | INTRAVENOUS | Status: DC | PRN
  Administered 2018-08-27: 1000 mL via INTRAVENOUS

## 2018-08-26 MED ORDER — TERBUTALINE SULFATE 1 MG/ML IJ SOLN: 0.2500 mg | Freq: Once | INTRAMUSCULAR | Status: DC | PRN

## 2018-08-26 MED ORDER — POTASSIUM CHLORIDE CRYS ER 10 MEQ PO TBCR
40.0000 meq | EXTENDED_RELEASE_TABLET | Freq: Two times a day (BID) | ORAL | Status: DC
  Filled 2018-08-26: qty 4

## 2018-08-26 MED ORDER — ACETAMINOPHEN 500 MG PO TABS
1000.0000 mg | ORAL_TABLET | Freq: Four times a day (QID) | ORAL | Status: DC | PRN
  Administered 2018-08-26: 1000 mg via ORAL
  Filled 2018-08-26: qty 2

## 2018-08-26 MED ORDER — LIDOCAINE HCL (PF) 1 % IJ SOLN: 30.0000 mL | INTRAMUSCULAR | Status: DC | PRN

## 2018-08-26 MED ORDER — POTASSIUM CHLORIDE CRYS ER 10 MEQ PO TBCR
40.0000 meq | EXTENDED_RELEASE_TABLET | Freq: Three times a day (TID) | ORAL | Status: DC
  Administered 2018-08-26: 40 meq via ORAL
  Filled 2018-08-26: qty 4

## 2018-08-26 MED ORDER — OXYTOCIN 40 UNITS IN NORMAL SALINE INFUSION - SIMPLE MED
1.0000 m[IU]/min | INTRAVENOUS | Status: DC
  Administered 2018-08-27: 2 m[IU]/min via INTRAVENOUS

## 2018-08-26 MED ORDER — MISOPROSTOL 25 MCG QUARTER TABLET
25.0000 ug | ORAL_TABLET | ORAL | Status: DC | PRN
  Administered 2018-08-26: 25 ug via BUCCAL
  Filled 2018-08-26 (×2): qty 1

## 2018-08-26 NOTE — OB Triage Note (Signed)
Pt presented to L/D from home after referral from office. The pt experienced nausea around 10 am. Upon checking her BP, it was elevated. She is experiencing no headache or other PIH symptoms. Pos fetal movement. No bleeding or LOF. The pt rates her pain 6/10 and describes it as intermittent back pain. Patient stable at this time. Last BP 131/94.

## 2018-08-26 NOTE — H&P (Signed)
OB History & Physical   History of Present Illness:  Chief Complaint:   HPI:  KAYREN HOLCK is a 23 y.o. G15P1011 female at [redacted]w[redacted]d dated by11-week ultrasound with estimated date of delivery of 09/15/2018 She presents to L&D For induction of labor due to new diagnosis of gestational hypertension at 37 weeks  +FM, no CTX, no LOF, no VB Denies: HA, visual changes, SOB, or RUQ/epigastric pain  Pregnancy Issues: 1. Transfer of care @ 37wks 2. Anemia 3. History of postpartum hemorrhage   Maternal Medical History:   Past Medical History:  Diagnosis Date  . ADD (attention deficit disorder)    ADHD as per pt  . Asthma   . Depression   . Eczema   . Fatty liver   . Headache   . High cholesterol   . Murmur   . Orthostatic hypotension     Past Surgical History:  Procedure Laterality Date  . ADENOIDECTOMY    . TONSILLECTOMY AND ADENOIDECTOMY      Allergies  Allergen Reactions  . Peanut-Containing Drug Products   . Penicillins Anaphylaxis  . Shellfish Allergy Hives and Shortness Of Breath  . Sulfa Antibiotics Anaphylaxis  . Amoxicillin Rash    Prior to Admission medications   Medication Sig Start Date End Date Taking? Authorizing Provider  ferrous sulfate 325 (65 FE) MG tablet Take 325 mg by mouth daily with breakfast.   Yes [provider]  omeprazole (PRILOSEC) 40 MG capsule Take 1 capsule (40 mg total) by mouth 2 (two) times daily before a meal. 07/15/18  Yes Shambley, Melody N, CNM  potassium chloride (K-DUR) 20 MEQ tablet Take 2 tablets (40 mEq total) by mouth 2 (two) times daily. 08/25/18  Yes McVey, Murray Hodgkins, CNM  Prenatal Vit-Fe Fumarate-FA (PRENATAL MULTIVITAMIN) TABS tablet Take 1 tablet by mouth daily at 12 noon.   Yes [provider]  sertraline (ZOLOFT) 50 MG tablet Take 1 tablet (50 mg total) by mouth daily. Start with 25 mg (half tablet) daily for one (1) week then increase. Patient not taking: Reported on 08/25/2018 05/27/18   Diona Fanti, CNM     Prenatal care site: [place]    Social History: She  reports that she quit smoking about 3 years ago. She has a 0.25 pack-year smoking history. She has quit using smokeless tobacco. She reports previous alcohol use. She reports that she does not use drugs.  Family History: family history includes Breast cancer in her paternal grandmother; Cancer - Other in her mother; Berenice Primas' disease in her mother; Lupus in her mother; Thyroid disease in her mother.   Review of Systems: A full review of systems was performed and negative except as noted in the HPI.     Physical Exam:  Vital Signs: BP (!) 133/91   Pulse 89   Resp 16   Ht 5\' 8"  (1.727 m)   Wt 92.1 kg   LMP 12/09/2017   BMI 30.87 kg/m  General: no acute distress.  HEENT: normocephalic, atraumatic Heart: regular rate & rhythm.  No murmurs/rubs/gallops Lungs: clear to auscultation bilaterally, normal respiratory effort Abdomen: soft, gravid, non-tender;  EFW: 7.5lbs Pelvic:   External: Normal external female genitalia  Cervix: Dilation: 2 / Effacement (%): 80 / Station: -2    Extremities: non-tender, symmetric, mild edema bilaterally.  DTRs: 2+  Neurologic: Alert & oriented x 3.    Results for orders placed or performed during the hospital encounter of 08/26/18 (from the past 24 hour(s))  SARS Coronavirus 2 [facility](Hospital order, Performed in [facility] hospital lab) Nasopharyngeal Nasopharyngeal Swab     Status: None   Collection Time: 08/26/18  2:35 PM   Specimen: Nasopharyngeal Swab  Result Value Ref Range   SARS Coronavirus 2 NEGATIVE NEGATIVE  CBC     Status: Abnormal   Collection Time: 08/26/18  5:34 PM  Result Value Ref Range   WBC 9.6 4.0 - 10.5 K/uL   RBC 3.74 (L) 3.87 - 5.11 MIL/uL   Hemoglobin 10.1 (L) 12.0 - 15.0 g/dL   HCT 16.130.4 (L) 09.636.0 - 04.546.0 %   MCV 81.3 80.0 - 100.0 fL   MCH 27.0 26.0 - 34.0 pg   MCHC 33.2 30.0 - 36.0 g/dL   RDW 40.913.0 81.111.5 - 91.415.5 %   Platelets 211 150 - 400 K/uL    nRBC 0.0 0.0 - 0.2 %  Type and screen [facility]     Status: None   Collection Time: 08/26/18  5:34 PM  Result Value Ref Range   ABO/RH(D) A POS    Antibody Screen NEG    Sample Expiration      08/29/2018,2359 Performed at [facility], [address] [place], [place] [address]   Protein / creatinine ratio, urine     Status: Abnormal   Collection Time: 08/26/18  5:34 PM  Result Value Ref Range   Creatinine, Urine 221 mg/dL   Total Protein, Urine 61 mg/dL   Protein Creatinine Ratio 0.28 (H) 0.00 - 0.15 mg/mg[Cre]  Comprehensive metabolic panel     Status: Abnormal   Collection Time: 08/26/18  5:34 PM  Result Value Ref Range   Sodium 139 135 - 145 mmol/L   Potassium 2.7 (LL) 3.5 - 5.1 mmol/L   Chloride 107 98 - 111 mmol/L   CO2 21 (L) 22 - 32 mmol/L   Glucose, Bld 80 70 - 99 mg/dL   BUN <5 (L) 6 - 20 mg/dL   Creatinine, Ser 6.210.50 0.44 - 1.00 mg/dL   Calcium 8.6 (L) 8.9 - 10.3 mg/dL   Total Protein 6.1 (L) 6.5 - 8.1 g/dL   Albumin 3.0 (L) 3.5 - 5.0 g/dL   AST 22 15 - 41 U/L   ALT 12 0 - 44 U/L   Alkaline Phosphatase 113 38 - 126 U/L   Total Bilirubin 0.6 0.3 - 1.2 mg/dL   GFR calc non Af Amer >60 >60 mL/min   GFR calc Af Amer >60 >60 mL/min   Anion gap 11 5 - 15    Pertinent Results:  Prenatal Labs: Blood type/Rh A pos  Antibody screen neg  Rubella Immune  Varicella Immune  RPR NR  HBsAg Neg  HIV NR  GC neg  Chlamydia neg  Genetic screening negative  1 hour GTT 121  3 hour GTT --  GBS neg   FHT: 125 mod + accels no decels TOCO: rare SVE:  Dilation: 2 / Effacement (%): 80 / Station: -2    Cephalic by leopolds   Assessment:  Marcy SalvoChelsea N Quayle is a 23 y.o. 493P1011 female at 7873w1d with IOL due to gestational hypertension at term.   Plan:  1. Admit to Labor & Delivery 2. CBC, T&S, Clrs, IVF 3. GBS  neg 4. Consents obtained. 5. Continuous efm/toco 6. Hypokalemia:  Replete K+ with PO kdur TID until improved 7. IOL: cytotec  to start, AROM when able 8. IUP: category 1 tracing  ----- Ranae Plumberhelsea , MD Attending Obstetrician and [facility],  Department of OB/GYN [facility]

## 2018-08-27 ENCOUNTER — Inpatient Hospital Stay: Payer: Medicaid Other | Admitting: Anesthesiology

## 2018-08-27 ENCOUNTER — Encounter: Payer: Self-pay | Admitting: *Deleted

## 2018-08-27 LAB — COMPREHENSIVE METABOLIC PANEL
ALT: 9 U/L (ref 0–44)
AST: 20 U/L (ref 15–41)
Albumin: 2.7 g/dL — ABNORMAL LOW (ref 3.5–5.0)
Alkaline Phosphatase: 107 U/L (ref 38–126)
Anion gap: 7 (ref 5–15)
BUN: 5 mg/dL — ABNORMAL LOW (ref 6–20)
CO2: 20 mmol/L — ABNORMAL LOW (ref 22–32)
Calcium: 7.9 mg/dL — ABNORMAL LOW (ref 8.9–10.3)
Chloride: 109 mmol/L (ref 98–111)
Creatinine, Ser: 0.53 mg/dL (ref 0.44–1.00)
GFR calc Af Amer: >60 mL/min (ref >60)
GFR calc non Af Amer: >60 mL/min (ref >60)
Glucose, Bld: 97 mg/dL (ref 70–99)
Potassium: 3.2 mmol/L — ABNORMAL LOW (ref 3.5–5.1)
Sodium: 136 mmol/L (ref 135–145)
Total Bilirubin: 0.6 mg/dL (ref 0.3–1.2)
Total Protein: 5.4 g/dL — ABNORMAL LOW (ref 6.5–8.1)

## 2018-08-27 LAB — PROTIME-INR
INR: 1.1 (ref 0.8–1.2)
Prothrombin Time: 14.5 seconds (ref 11.4–15.2)

## 2018-08-27 LAB — APTT: aPTT: 29 seconds (ref 24–36)

## 2018-08-27 LAB — FIBRINOGEN: Fibrinogen: 320 mg/dL (ref 210–475)

## 2018-08-27 LAB — CBC
HCT: 24.7 % — ABNORMAL LOW (ref 36.0–46.0)
Hemoglobin: 8 g/dL — ABNORMAL LOW (ref 12.0–15.0)
MCH: 26.8 pg (ref 26.0–34.0)
MCHC: 32.4 g/dL (ref 30.0–36.0)
MCV: 82.9 fL (ref 80.0–100.0)
Platelets: 183 10*3/uL (ref 150–400)
RBC: 2.98 MIL/uL — ABNORMAL LOW (ref 3.87–5.11)
RDW: 13.1 % (ref 11.5–15.5)
WBC: 11.6 10*3/uL — ABNORMAL HIGH (ref 4.0–10.5)
nRBC: 0 % (ref 0.0–0.2)

## 2018-08-27 MED ORDER — LACTATED RINGERS IV SOLN: 500.0000 mL | Freq: Once | INTRAVENOUS | Status: DC

## 2018-08-27 MED ORDER — LIDOCAINE HCL (PF) 1 % IJ SOLN
INTRAMUSCULAR | Status: DC | PRN
  Administered 2018-08-27 (×2): 3 mL

## 2018-08-27 MED ORDER — LIDOCAINE-EPINEPHRINE (PF) 1.5 %-1:200000 IJ SOLN: INTRAMUSCULAR | Status: DC | PRN

## 2018-08-27 MED ORDER — SENNOSIDES-DOCUSATE SODIUM 8.6-50 MG PO TABS
2.0000 | ORAL_TABLET | ORAL | Status: DC
  Administered 2018-08-28: 2 via ORAL
  Filled 2018-08-27: qty 2

## 2018-08-27 MED ORDER — FENTANYL 2.5 MCG/ML W/ROPIVACAINE 0.15% IN NS 100 ML EPIDURAL (ARMC)
12.0000 mL/h | EPIDURAL | Status: DC
  Administered 2018-08-27: 12 mL/h via EPIDURAL

## 2018-08-27 MED ORDER — CLINDAMYCIN PHOSPHATE 900 MG/50ML IV SOLN
900.0000 mg | Freq: Once | INTRAVENOUS | Status: AC
  Administered 2018-08-27: 900 mg via INTRAVENOUS
  Filled 2018-08-27: qty 50

## 2018-08-27 MED ORDER — ONDANSETRON HCL 4 MG/2ML IJ SOLN: 4.0000 mg | INTRAMUSCULAR | Status: DC | PRN

## 2018-08-27 MED ORDER — ACETAMINOPHEN 325 MG PO TABS
650.0000 mg | ORAL_TABLET | ORAL | Status: DC | PRN
  Administered 2018-08-27 – 2018-08-29 (×2): 650 mg via ORAL
  Filled 2018-08-27 (×2): qty 2

## 2018-08-27 MED ORDER — EPHEDRINE 5 MG/ML INJ: 10.0000 mg | INTRAVENOUS | Status: DC | PRN

## 2018-08-27 MED ORDER — POTASSIUM CHLORIDE CRYS ER 20 MEQ PO TBCR
40.0000 meq | EXTENDED_RELEASE_TABLET | Freq: Three times a day (TID) | ORAL | Status: DC
  Administered 2018-08-27 – 2018-08-28 (×3): 40 meq via ORAL
  Filled 2018-08-27 (×4): qty 2

## 2018-08-27 MED ORDER — COCONUT OIL OIL: 1.0000 "application " | TOPICAL_OIL | Status: DC | PRN

## 2018-08-27 MED ORDER — LIDOCAINE-EPINEPHRINE (PF) 1.5 %-1:200000 IJ SOLN
INTRAMUSCULAR | Status: DC | PRN
  Administered 2018-08-27: 3 mL via PERINEURAL

## 2018-08-27 MED ORDER — METHYLERGONOVINE MALEATE 0.2 MG PO TABS
0.2000 mg | ORAL_TABLET | Freq: Three times a day (TID) | ORAL | Status: AC
  Administered 2018-08-27 – 2018-08-28 (×2): 0.2 mg via ORAL
  Filled 2018-08-27 (×2): qty 1

## 2018-08-27 MED ORDER — PRENATAL MULTIVITAMIN CH
1.0000 | ORAL_TABLET | Freq: Every day | ORAL | Status: DC
  Administered 2018-08-28: 1 via ORAL
  Filled 2018-08-27: qty 1

## 2018-08-27 MED ORDER — WITCH HAZEL-GLYCERIN EX PADS: 1.0000 "application " | MEDICATED_PAD | CUTANEOUS | Status: DC | PRN

## 2018-08-27 MED ORDER — BUPIVACAINE HCL (PF) 0.25 % IJ SOLN
INTRAMUSCULAR | Status: DC | PRN
  Administered 2018-08-27: 10 mL via EPIDURAL

## 2018-08-27 MED ORDER — BENZOCAINE-MENTHOL 20-0.5 % EX AERO
1.0000 "application " | INHALATION_SPRAY | CUTANEOUS | Status: DC | PRN
  Filled 2018-08-27: qty 56

## 2018-08-27 MED ORDER — OXYTOCIN BOLUS FROM INFUSION
500.0000 mL | Freq: Once | INTRAVENOUS | Status: AC
  Administered 2018-08-27: 500 mL via INTRAVENOUS

## 2018-08-27 MED ORDER — VITAMIN C 500 MG PO TABS
500.0000 mg | ORAL_TABLET | Freq: Two times a day (BID) | ORAL | Status: DC
  Administered 2018-08-28 (×2): 500 mg via ORAL
  Filled 2018-08-27 (×3): qty 1

## 2018-08-27 MED ORDER — OXYCODONE HCL 5 MG PO TABS
5.0000 mg | ORAL_TABLET | Freq: Once | ORAL | Status: AC | PRN
  Administered 2018-08-27: 5 mg via ORAL
  Filled 2018-08-27: qty 1

## 2018-08-27 MED ORDER — ONDANSETRON HCL 4 MG PO TABS: 4.0000 mg | ORAL_TABLET | ORAL | Status: DC | PRN

## 2018-08-27 MED ORDER — PHENYLEPHRINE 40 MCG/ML (10ML) SYRINGE FOR IV PUSH (FOR BLOOD PRESSURE SUPPORT): 80.0000 ug | PREFILLED_SYRINGE | INTRAVENOUS | Status: DC | PRN

## 2018-08-27 MED ORDER — FERROUS SULFATE 325 (65 FE) MG PO TABS
325.0000 mg | ORAL_TABLET | Freq: Two times a day (BID) | ORAL | Status: DC
  Administered 2018-08-27 – 2018-08-29 (×4): 325 mg via ORAL
  Filled 2018-08-27 (×4): qty 1

## 2018-08-27 MED ORDER — FENTANYL 2.5 MCG/ML W/ROPIVACAINE 0.15% IN NS 100 ML EPIDURAL (ARMC)
EPIDURAL | Status: AC
  Filled 2018-08-27: qty 100

## 2018-08-27 MED ORDER — IBUPROFEN 600 MG PO TABS
600.0000 mg | ORAL_TABLET | Freq: Four times a day (QID) | ORAL | Status: DC
  Administered 2018-08-27 – 2018-08-29 (×7): 600 mg via ORAL
  Filled 2018-08-27 (×7): qty 1

## 2018-08-27 MED ORDER — POTASSIUM CHLORIDE CRYS ER 20 MEQ PO TBCR
40.0000 meq | EXTENDED_RELEASE_TABLET | Freq: Three times a day (TID) | ORAL | Status: DC
  Administered 2018-08-27: 40 meq via ORAL
  Filled 2018-08-27: qty 4

## 2018-08-27 MED ORDER — DIBUCAINE (PERIANAL) 1 % EX OINT: 1.0000 "application " | TOPICAL_OINTMENT | CUTANEOUS | Status: DC | PRN

## 2018-08-27 MED ORDER — TRANEXAMIC ACID-NACL 1000-0.7 MG/100ML-% IV SOLN
1000.0000 mg | Freq: Once | INTRAVENOUS | Status: DC | PRN
  Filled 2018-08-27: qty 100

## 2018-08-27 MED ORDER — MISOPROSTOL 200 MCG PO TABS
800.0000 ug | ORAL_TABLET | Freq: Once | ORAL | Status: AC
  Administered 2018-08-27: 800 ug via RECTAL

## 2018-08-27 MED ORDER — DIPHENHYDRAMINE HCL 25 MG PO CAPS: 25.0000 mg | ORAL_CAPSULE | Freq: Four times a day (QID) | ORAL | Status: DC | PRN

## 2018-08-27 MED ORDER — SIMETHICONE 80 MG PO CHEW: 160.0000 mg | CHEWABLE_TABLET | ORAL | Status: DC | PRN

## 2018-08-27 MED ORDER — DIPHENHYDRAMINE HCL 50 MG/ML IJ SOLN: 12.5000 mg | INTRAMUSCULAR | Status: DC | PRN

## 2018-08-27 NOTE — Progress Notes (Signed)
Post Partum Day 0  Subjective: Called to assess patient for total QBL ~1959mL. Patient also had episode of dizziness with large amount of bleeding when getting up to the bathroom for the second time. She felt fine the first time she got up to the bathroom earlier this afternoon.   Now resting in bed, reports feeling much better.   Objective: BP 128/89 (BP Location: Right Arm) Comment: back to bed, dizziness  Pulse 92   Temp 97.6 F (36.4 C) (Oral)   Resp 18   Ht 5\' 8"  (1.727 m)   Wt 92.1 kg   LMP 12/09/2017   SpO2 100%   Breastfeeding Unknown   BMI 30.87 kg/m    Physical Exam:  General: alert, cooperative, appears stated age and no distress Breasts: soft/nontender CV: RRR Pulm: nl effort, CTABL Abdomen: soft, non-tender, active bowel sounds Uterine Fundus: firm Lochia: appropriate  Recent Labs    08/26/18 1734 08/27/18 1816  HGB 10.1* 8.0*  HCT 30.4* 24.7*  WBC 9.6 11.6*  PLT 211 183    Assessment/Plan: 23 y.o. [id number] postpartum day # 0  -Continue routine postpartum care -Acute blood loss anemia related to postpartum bleeding/hemorrhage: QBL 71mL at delivery, additional QBL 759mL while on L&D postpartum, and QBL 444mL on postpartum. She received bolus of 20 units IV pitocin after delivery and 876mcg misoprostol per rectum. She has been getting 62.67mL/hr of IV pitocin 40units/1L since. Additional 20 unit bolus of IV pitocin given now, along with 0.2mg  Methegine PO q8h x 2 doses. Hemoglobin 8.0 now with platelets, fibrinogen, PT, and aPTT WNL. Continue ferrous sulfate PO BID, vitamin C added to aid with absorption. Plan to recheck CBC at 0000.   Dr. Leonides Schanz updated on patient condition and plan of care.   Disposition: Continue inpatient postpartum care    LOS: 1 day   [name], CNM 08/27/2018, 7:05 PM   ----- [name] Certified Nurse Midwife [facility]

## 2018-08-27 NOTE — Progress Notes (Signed)
Labor Progress Note  Madison Jenkins is a 23 y.o. G3P1011 at [redacted]w[redacted]d by [redacted]w[redacted]d ultrasound admitted for induction of labor due to gestational hypertension.  Subjective:  Comfortable after epidural placement.   Objective: BP 129/78   Pulse 90   Temp 98.8 F (37.1 C) (Oral)   Resp 18   Ht 5\' 8"  (1.727 m)   Wt 92.1 kg   LMP 12/09/2017   SpO2 98%   BMI 30.87 kg/m  Notable VS details: normal to mild range BPs  Fetal Assessment: FHT:  FHR: 125 bpm, variability: moderate,  accelerations:  Present,  decelerations:  Present early, variable Category/reactivity:  Category II UC:   regular, every 3-4 minutes SVE: per RN Tresa Moore Elks now Dilation: complete  Effacement: complete  Station:  0  Membrane status: AROM 08/27/2018 0614 Amniotic color: clear  Labs: Lab Results  Component Value Date   WBC 9.6 08/26/2018   HGB 10.1 (L) 08/26/2018   HCT 30.4 (L) 08/26/2018   MCV 81.3 08/26/2018   PLT 211 08/26/2018    Assessment / Plan: Induction of labor for gestational hypertension  Labor: Complete, will begin to push now Fetal Wellbeing:  Category II, overall reassuring  Pain Control:  Epidural Anticipated MOD:  NSVD  [name], CNM 08/27/2018, 11:54 AM

## 2018-08-27 NOTE — Progress Notes (Signed)
CNM in department to see patient. IV bolus of 512ml (20units pit) given per CNM. Fundus checked by CNM; fundus firm, small amount of pooled blood with fundal check. Peri pads changed. Fundus rechecked by CNM. Bleeding small. No trickle or pooling of blood this check. Labia swollen, ice packs continually encouraged. Patient to call before getting out of bed. RN to continue to monitor.

## 2018-08-27 NOTE — Anesthesia Procedure Notes (Signed)
Epidural Patient location during procedure: OB Start time: 08/27/2018 9:10 AM End time: 08/27/2018 9:43 AM  Staffing Anesthesiologist: Alvin Critchley, MD Performed: anesthesiologist   Preanesthetic Checklist Completed: patient identified, site marked, surgical consent, pre-op evaluation, timeout performed, IV checked, risks and benefits discussed and monitors and equipment checked  Epidural Patient position: sitting Prep: Betadine Patient monitoring: heart rate, continuous pulse ox and blood pressure Approach: midline Location: L3-L4 Injection technique: LOR air  Needle:  Needle type: Tuohy  Needle gauge: 17 G Needle length: 9 cm and 9 Catheter type: closed end flexible Catheter size: 19 Gauge Test dose: negative and 1.5% lidocaine with Epi 1:200 K  Assessment Sensory level: T8 Events: blood not aspirated, injection not painful, no injection resistance, negative IV test and no paresthesia  Additional Notes Time out called.  Patient placed in sitting position, but noticed some protrusion of left scapula.  Back prepped and draped in sterile fashion and a skin wheal was made in the L3-L4 interspace with 1% Lidocaine plain.  Initial attempts at midline encountered bone.  Patient was then repositioned and reprepped and draped in sterile fashion .  A skin wheal was made about 0.75 inches from midline with 1% Lidocaine plain and a 17G Tuohy needle was guided into the epidural space by a loss of resistance technique.  No blood, fluid or paresthesias noted.  Patient tolerated the procedure well.  Discussed with the patient that her back presented with some curve and torque despite reattempt at positioning and that the epidural was accessed to the left of midline.Reason for block:procedure for pain

## 2018-08-27 NOTE — Progress Notes (Signed)
Labor Progress Note  Madison Jenkins is a 23 y.o. G3P1011 at [redacted]w[redacted]d by [redacted]w[redacted]d ultrasound admitted for induction of labor due to gestational hypertension.  Subjective:  Comfortable after epidural placement.   Objective: BP 129/88   Pulse 96   Temp 98.6 F (37 C) (Oral)   Resp 18   Ht 5\' 8"  (1.727 m)   Wt 92.1 kg   LMP 12/09/2017   SpO2 100%   BMI 30.87 kg/m  Notable VS details: normal to mild range BPs  Fetal Assessment: FHT:  FHR: 125 bpm, variability: moderate,  accelerations:  Present,  decelerations:  Present early, variable Category/reactivity:  Category II UC:   regular, every 3 minutes SVE: per RN Tresa Moore [place] now Dilation: 9cm  Effacement: 100%  Station:  0  Membrane status: AROM 08/27/2018 Amniotic color: 0614  Labs: Lab Results  Component Value Date   WBC 9.6 08/26/2018   HGB 10.1 (L) 08/26/2018   HCT 30.4 (L) 08/26/2018   MCV 81.3 08/26/2018   PLT 211 08/26/2018    Assessment / Plan: Induction of labor for gestational hypertension  Labor: s/p 1 dose of misoprostol, pitocin started at [place], currently infusing at 73mu/min Fetal Wellbeing:  Category II, overall reassuring  Pain Control:  Epidural Anticipated MOD:  NSVD  [name], CNM 08/27/2018, 10:38 AM

## 2018-08-27 NOTE — Discharge Summary (Signed)
Obstetric Discharge Summary   Patient Name: Madison Jenkins DOB: 1995-09-15 MRN: [id number]  Date of Admission: 08/26/2018 Date of Delivery: 08/27/2018 Delivered by: [name], CNM  Date of Discharge: 08/29/2018  Primary OB: [place]  FUX:NATFTDD'U last menstrual period was 12/09/2017. EDC Estimated Date of Delivery: 09/15/18 Gestational Age at Delivery: [redacted]w[redacted]d   Antepartum complications:  1. Transfer of care at 36w from [facility] 2. Anemia on iron supplement 3. History of postpartum hemorrhage 4. Gestational hypertension  Admitting Diagnosis: Gestational hypertension  Secondary Diagnoses: Patient Active Problem List   Diagnosis Date Noted  . Labor and delivery indication for care or intervention 08/26/2018  . Elevated BP without diagnosis of hypertension 08/25/2018  . Anxiety 07/15/2018  . GERD (gastroesophageal reflux disease) 07/15/2018  . Low-lying placenta 05/27/2018  . Back pain 02/23/2015    Induction/Augmentation: AROM, Pitocin and Cytotec Complications: None Intrapartum complications/course: Kathye presented to L&D with gestational hypertension. She was induced with misoprostol and pitocin. She was augmented with AROM. Epidural placed for pain relief. She progressed to complete and had a spontaneous vaginal birth of a live female over an intact perineum. The fetal head was delivered in LOT position with restitution to LOP. No nuchal cord. Anterior then posterior shoulders delivered spontaneously. Baby placed on mom's abdomen and attended to by transition RN. Cord clamped and cut after 5 minute delay by father of the baby. Placenta delivered spontaneously intact with 3VC at 1243. Uterine tone boggy with small to moderate bleeding. Uterus firmed with fundal massage. IV pitocin given for hemorrhage prophylaxis along with rectal misoprostol. Small amount of clots cleared from vagina. Bleeding improved. Placenta sent to pathology for history of gestational  hypertension.   Delivery Type: spontaneous vaginal delivery Anesthesia: epidural Placenta: spontaneous Laceration: none Episiotomy: none  Newborn Data: Live born female "Alijah" Birth Weight: 3320g 7lb 5.1oz APGAR: 8, 9  Newborn Delivery   Birth date/time: 08/27/2018 12:35:00 Delivery type: Vaginal, Spontaneous        Postpartum Course  Patient had an uncomplicated postpartum course. By time of discharge on PPD#1, her pain was controlled on oral pain medications; she had appropriate lochia and was ambulating, voiding without difficulty and tolerating regular diet.  She was deemed stable for discharge to home.       Labs: CBC Latest Ref Rng & Units 08/28/2018 08/28/2018 08/27/2018  WBC 4.0 - 10.5 K/uL 10.2 10.0 11.6(H)  Hemoglobin 12.0 - 15.0 g/dL 8.3(L) 7.1(L) 8.0(L)  Hematocrit 36.0 - 46.0 % 25.6(L) 22.0(L) 24.7(L)  Platelets 150 - 400 K/uL 166 166 183   A POS  Physical exam:  BP 134/86 (BP Location: Right Arm)   Pulse 81   Temp 98.4 F (36.9 C) (Oral)   Resp 20   Ht 5\' 8"  (1.727 m)   Wt 92.1 kg   LMP 12/09/2017   SpO2 100%   Breastfeeding Unknown   BMI 30.87 kg/m  General: alert and no distress Pulm: normal respiratory effort Lochia: appropriate Abdomen: soft, NT Uterine Fundus: firm, below umbilicus Extremities: No evidence of DVT seen on physical exam. No lower extremity edema.  Disposition: stable, discharge to home Baby Feeding: formula & breastmilk Baby Disposition: home with mom  Contraception: OCPs  Prenatal Labs:  Blood type/Rh A+  Antibody screen neg  Rubella Immune  Varicella Immune  RPR NR  HBsAg Neg  HIV NR  GC neg  Chlamydia neg  Genetic screening cfDNA negative  1 hour GTT 121  3 hour GTT n/a  GBS negative  Rh Immune globulin given: n/a Rubella vaccine given: n/a Tdap vaccine given in AP or PP setting: 06/29/2018 AP  Plan:  Marcy SalvoChelsea N Doerner was discharged to home in good condition. [facility]  with delivery provider in 6 weeks  Discharge Instructions: Per After Visit Summary. Activity: Advance as tolerated. Pelvic rest for 6 weeks.   Diet: Regular Discharge Medications: Allergies as of 08/29/2018      Reactions   Peanut-containing Drug Products    Penicillins Anaphylaxis   Shellfish Allergy Hives, Shortness Of Breath   Sulfa Antibiotics Anaphylaxis   Amoxicillin Rash      Medication List    STOP taking these medications   omeprazole 40 MG capsule Commonly known as: PRILOSEC   potassium chloride SA 20 MEQ tablet Commonly known as: K-DUR   sertraline 50 MG tablet Commonly known as: Zoloft     TAKE these medications   acetaminophen 500 MG tablet Commonly known as: Tylenol Take 2 tablets (1,000 mg total) by mouth every 6 (six) hours as needed for mild pain, moderate pain or headache.   ascorbic acid 500 MG tablet Commonly known as: VITAMIN C Take 1 tablet (500 mg total) by mouth 2 (two) times daily with a meal.   ferrous sulfate 325 (65 FE) MG tablet Take 1 tablet (325 mg total) by mouth 2 (two) times daily with a meal. What changed: when to take this   ibuprofen 600 MG tablet Commonly known as: ADVIL Take 1 tablet (600 mg total) by mouth every 6 (six) hours as needed for mild pain or moderate pain.   POT BICARB-POT CHLORIDE(25MEQ) 25 MEQ Tbef Dissolve one effervescent tablet completely in 3-4 ounces of cold water. Take with a meal and sip slowly over a 5-10 minute period.   prenatal multivitamin Tabs tablet Take 1 tablet by mouth daily at 12 noon.      Outpatient follow up:  Follow-up Information    Genia DelHaviland, Margaret, CNM. Schedule an appointment as soon as possible for a visit in 6 week(s).   Specialty: Certified Nurse Midwife Why: For routine postpartum visit Contact information: [address] [place] [address] [phone]        [facility] Go in [facility](s).   Why: For lab draw and make PP check up appt with Claris CheMargaret.   Contact information: [address] [place] [address] [phone]            Signed: Randa NgoRebecca A Nicholai Willette, CNM 08/29/2018 9:30 AM

## 2018-08-27 NOTE — Progress Notes (Signed)
Upon transfer of care report, birthplace RN stated that patient had some moderate bleeding with QBL of 1457ml, that fundus was firm and last couple fundal checks have been normal. At first two q1hr fundal checks on mother baby unit (at 1535 and 1626) , patient's bleeding was small and patient had a dime sized blood clot at first check. Patient called nurses station and said she felt "a little more gushing." RN checked bleeding and fundus again. Bleeding was small to moderate, fundus U/E and to the right. Patient up to the bathroom with RN. Patient felt "big gush at this time. Bleeding moderate, pads weighed. EBL 478 from pads and lemon sized clot. QBL 1923 at this time. Patient feeling dizzy and nauseous. Second RN to room. Patient placed in wheelchair and back to bed. Vitals stable and WDL. Fundus firm at U/E and bleeding small after voiding 224ml. Provider, M. Haviland, updated about patient bleeding, being in stage 3 of PPH order set, and patient dizziness episode in bathroom. CNM to consult with MD and orders to be placed. CNM to come assess patient as well.  Patient and husband very concerned about bleeding because of history of PPH. Tons of education and emotional support given. Patient to call RN with anymore gushes of blood or clots. RN to continue to monitor.

## 2018-08-27 NOTE — Plan of Care (Signed)
Pt. Is alert and oriented with aprop. Affect. Color pale, skin w&d, BBS clear. S/P PPH. Assessing closely. Fundus is Firm at U/E, 35cc QBL when assisted up to Bedside commode at 2030. Tolerated movement to [facility] without c/o.  Pt. Has 0000 CBC due to [place]. Receiving NS with Pitocin as ordered and Methergine as ordered. Will cont to follow closely. Pt. Denies c/o.

## 2018-08-27 NOTE — Anesthesia Preprocedure Evaluation (Addendum)
Anesthesia Evaluation  Patient identified by MRN, date of birth, ID band Patient awake    Reviewed: Allergy & Precautions, NPO status , Patient's Chart, lab work & pertinent test results  Airway Mallampati: II       Dental no notable dental hx.    Pulmonary asthma , former smoker,    breath sounds clear to auscultation       Cardiovascular Exercise Tolerance: Good + Valvular Problems/Murmurs  Rhythm:Regular Rate:Normal     Neuro/Psych  Headaches, PSYCHIATRIC DISORDERS Anxiety Depression    GI/Hepatic Neg liver ROS, GERD  ,  Endo/Other  negative endocrine ROS  Renal/GU      Musculoskeletal negative musculoskeletal ROS (+)   Abdominal Normal abdominal exam  (+)   Peds  Hematology negative hematology ROS (+)   Anesthesia Other Findings   Reproductive/Obstetrics (+) Pregnancy                             Anesthesia Physical  Anesthesia Plan  ASA: II  Anesthesia Plan: Epidural   Post-op Pain Management:    Induction:   PONV Risk Score and Plan:   Airway Management Planned: Natural Airway  Additional Equipment:   Intra-op Plan:   Post-operative Plan:   Informed Consent: I have reviewed the patients History and Physical, chart, labs and discussed the procedure including the risks, benefits and alternatives for the proposed anesthesia with the patient or authorized representative who has indicated his/her understanding and acceptance.       Plan Discussed with:   Anesthesia Plan Comments:         Anesthesia Quick Evaluation

## 2018-08-27 NOTE — Progress Notes (Signed)
Labor Progress Note  Madison Jenkins is a 23 y.o. G3P1011 at [redacted]w[redacted]d by [redacted]w[redacted]d ultrasound admitted for induction of labor due to gestational hypertension.  Subjective:  Feeling contractions painfully, desires epidural.   Objective: BP 140/85   Pulse 78   Temp 98.4 F (36.9 C) (Oral)   Resp 18   Ht 5\' 8"  (1.727 m)   Wt 92.1 kg   LMP 12/09/2017   BMI 30.87 kg/m  Notable VS details: normal to mild range BPs  Fetal Assessment: FHT:  FHR: 125 bpm, variability: moderate,  accelerations:  Present,  decelerations:  Present late, early Category/reactivity:  Category II UC:   regular, every 2-3 minutes SVE: per RN [name] at [phone] Dilation: 5cm  Effacement: 80%  Station:  -2  Position: posterior  Membrane status: AROM 08/27/2018 Amniotic color: 0614  Labs: Lab Results  Component Value Date   WBC 9.6 08/26/2018   HGB 10.1 (L) 08/26/2018   HCT 30.4 (L) 08/26/2018   MCV 81.3 08/26/2018   PLT 211 08/26/2018    Assessment / Plan: Induction of labor for gestational hypertension  Labor: s/p 1 dose of misoprostol, pitocin started at [place], currently infusing at 2mu/min Fetal Wellbeing:  Category II, overall reassruing Pain Control:  Epidural pending Anticipated MOD:  NSVD  [name], CNM 08/27/2018, 8:26 AM

## 2018-08-28 LAB — COMPREHENSIVE METABOLIC PANEL
ALT: 10 U/L (ref 0–44)
AST: 19 U/L (ref 15–41)
Albumin: 2.3 g/dL — ABNORMAL LOW (ref 3.5–5.0)
Alkaline Phosphatase: 84 U/L (ref 38–126)
Anion gap: 6 (ref 5–15)
BUN: 6 mg/dL (ref 6–20)
CO2: 22 mmol/L (ref 22–32)
Calcium: 8.1 mg/dL — ABNORMAL LOW (ref 8.9–10.3)
Chloride: 111 mmol/L (ref 98–111)
Creatinine, Ser: 0.4 mg/dL — ABNORMAL LOW (ref 0.44–1.00)
GFR calc Af Amer: >60 mL/min (ref >60)
GFR calc non Af Amer: >60 mL/min (ref >60)
Glucose, Bld: 78 mg/dL (ref 70–99)
Potassium: 3.6 mmol/L (ref 3.5–5.1)
Sodium: 139 mmol/L (ref 135–145)
Total Bilirubin: 0.5 mg/dL (ref 0.3–1.2)
Total Protein: 4.7 g/dL — ABNORMAL LOW (ref 6.5–8.1)

## 2018-08-28 LAB — CBC
HCT: 22 % — ABNORMAL LOW (ref 36.0–46.0)
HCT: 25.6 % — ABNORMAL LOW (ref 36.0–46.0)
Hemoglobin: 7.1 g/dL — ABNORMAL LOW (ref 12.0–15.0)
Hemoglobin: 8.3 g/dL — ABNORMAL LOW (ref 12.0–15.0)
MCH: 26.9 pg (ref 26.0–34.0)
MCH: 27.3 pg (ref 26.0–34.0)
MCHC: 32.3 g/dL (ref 30.0–36.0)
MCHC: 32.4 g/dL (ref 30.0–36.0)
MCV: 83.3 fL (ref 80.0–100.0)
MCV: 84.2 fL (ref 80.0–100.0)
Platelets: 166 10*3/uL (ref 150–400)
Platelets: 166 10*3/uL (ref 150–400)
RBC: 2.64 MIL/uL — ABNORMAL LOW (ref 3.87–5.11)
RBC: 3.04 MIL/uL — ABNORMAL LOW (ref 3.87–5.11)
RDW: 13.2 % (ref 11.5–15.5)
RDW: 13.3 % (ref 11.5–15.5)
WBC: 10 10*3/uL (ref 4.0–10.5)
WBC: 10.2 10*3/uL (ref 4.0–10.5)
nRBC: 0 % (ref 0.0–0.2)
nRBC: 0 % (ref 0.0–0.2)

## 2018-08-28 LAB — RPR: RPR Ser Ql: NONREACTIVE

## 2018-08-28 LAB — PREPARE RBC (CROSSMATCH)

## 2018-08-28 MED ORDER — POT BICARB-POT CHLORIDE 25 MEQ PO TBEF: EFFERVESCENT_TABLET | ORAL | 0 refills | Status: DC

## 2018-08-28 MED ORDER — SODIUM CHLORIDE 0.9% IV SOLUTION
Freq: Once | INTRAVENOUS | Status: AC
  Administered 2018-08-28: 02:00:00 via INTRAVENOUS

## 2018-08-28 MED ORDER — ASCORBIC ACID 500 MG PO TABS: 500.0000 mg | ORAL_TABLET | Freq: Two times a day (BID) | ORAL | 1 refills | Status: DC

## 2018-08-28 MED ORDER — IBUPROFEN 600 MG PO TABS: 600.0000 mg | ORAL_TABLET | Freq: Four times a day (QID) | ORAL | 0 refills | Status: DC | PRN

## 2018-08-28 MED ORDER — FERROUS SULFATE 325 (65 FE) MG PO TABS: 325.0000 mg | ORAL_TABLET | Freq: Two times a day (BID) | ORAL | 1 refills | Status: DC

## 2018-08-28 MED ORDER — POTASSIUM CHLORIDE CRYS ER 10 MEQ PO TBCR
40.0000 meq | EXTENDED_RELEASE_TABLET | Freq: Every day | ORAL | Status: DC
  Filled 2018-08-28: qty 4

## 2018-08-28 MED ORDER — ACETAMINOPHEN 500 MG PO TABS: 1000.0000 mg | ORAL_TABLET | Freq: Four times a day (QID) | ORAL | 0 refills | Status: DC | PRN

## 2018-08-28 NOTE — Discharge Instructions (Signed)

## 2018-08-28 NOTE — Progress Notes (Signed)
Patient has refused all of her morning meds, stating that she wants to wait until she eats to take medicine. As of 1100, patient has not eaten yet. Patient will call RN when she is ready for medicine.

## 2018-08-28 NOTE — Lactation Note (Signed)
This note was copied from a baby's chart. Lactation Consultation Note  Patient Name: Boy Luane Rochon [id number] Date: 08/28/2018   This mom's decision on admission was to breast feed.  After mom had post partum hemorrhage and Alijah refused the bottle and was spitting the formula, mom decided to breast feed.  Alijah had gotten used to the feel of bottle nipples in his mouth, so refused to latch to the breast at first.  Once #24 nipple shield was applied, he began strong rhythmic sucking.  Mom also wanted to pump.  Symphony breast pump set up in room with instructions in hand expressing, pumping, collection,  storage, cleaning, labeling and handling of expressed milk.  Explained feeding cues and encouraged to put Alijah to the breast whenever he demonstrated feeding cues.  Discussed newborn stomach size, supply and demand, normal course of lactation and routine newborn feeding patterns.  Lactation name and number written on white board and encouraged to call with any questions, concerns or assistance.  Maternal Data Formula Feeding for Exclusion: Yes Reason for exclusion: Mother's choice to formula and breast feed on admission  Feeding Feeding Type: Bottle Fed - Formula Nipple Type: Slow - flow  LATCH Score                   Interventions    Lactation Tools Discussed/Used     Consult Status      Jarold Motto 08/28/2018, 11:01 PM

## 2018-08-28 NOTE — Anesthesia Postprocedure Evaluation (Signed)
Anesthesia Post Note  Patient: Madison Jenkins  Procedure(s) Performed: AN AD HOC LABOR EPIDURAL  Patient location during evaluation: Mother Baby Anesthesia Type: Epidural Level of consciousness: awake and alert and oriented Pain management: pain level controlled Vital Signs Assessment: post-procedure vital signs reviewed and stable Respiratory status: spontaneous breathing Cardiovascular status: blood pressure returned to baseline Postop Assessment: no headache Anesthetic complications: no Comments: Challenging epidural but patient received good relief.  Ambulating well.  Mild tenderness at site without redness and little bruising noted.  Patient does notice some right paravertebral muscle tenderness related to pushing .     Last Vitals:  Vitals:   08/28/18 0445 08/28/18 0853  BP: 127/85 133/87  Pulse: 86 87  Resp: 18 18  Temp: 37.3 C 37.3 C  SpO2: 97% 98%    Last Pain:  Vitals:   08/28/18 1020  TempSrc:   PainSc: 0-No pain                 Gaile Allmon

## 2018-08-28 NOTE — Progress Notes (Signed)
Patient doing very well this morning. Vitals stable and WDL. Patient ambulating to the bathroom with minimal assistance. No complaints of dizziness or lightheadedness. Voiding adequately. Fundus firm, bleeding small. No pain at this time. Labia swollen, ice packs encouraged. Pitocin stopped and IV saline locked per verbal order from CNM. Patient to call RN with any heavy bleeding or abnormal feelings. Patient desires discharge today.  Patient has switched from formula to breastfeeding with a nipple shield. RN discussed breastfeeding basics, helped with latch this morning, and consulted with LC. RN to continue to monitor.

## 2018-08-28 NOTE — Progress Notes (Signed)
Unit of Blood sent back to Blood Bank because Bar Code would not Scan.

## 2018-08-28 NOTE — Progress Notes (Signed)
Post Partum Day 1  Subjective: Patient resting in bed, partner at the bedside.   Objective: BP 123/84 (BP Location: Right Arm)   Pulse 86   Temp 98.9 F (37.2 C) (Oral)   Resp 18   Ht 5\' 8"  (1.727 m)   Wt 92.1 kg   LMP 12/09/2017   SpO2 98%   Breastfeeding Unknown   BMI 30.87 kg/m     Recent Labs    08/27/18 1816 08/28/18 0012  HGB 8.0* 7.1*  HCT 24.7* 22.0*  WBC 11.6* 10.0  PLT 183 166    Assessment/Plan: 23 y.o. [id number] postpartum day # 1  -Acute blood loss anemia: Repeat hemoglobin 7.1 Discussed recommendations for transfusion of 1 unit of PRBCs with patient and her husband, including risks, benefits, and alternatives. Patient consented to transfusion. Plan to continue ferrous sulfate PO BID with vitamin C PO BID to aid absorption. Plan to recheck CBC at 0600.    Disposition: Continue inpatient postpartum care   LOS: 2 days   [name], CNM 08/28/2018, 12:42 AM   ----- [name] Certified Nurse Midwife [facility]

## 2018-08-29 NOTE — Progress Notes (Signed)
DC to home to car via [place].  To car via Nursing staff

## 2018-08-29 NOTE — Lactation Note (Signed)
This note was copied from a baby's chart. Lactation Consultation Note  Patient Name: Madison Jenkins Date: 08/29/2018 Reason for consult: Follow-up assessment   Maternal Data    Feeding Feeding Type: Breast Milk with Formula added  Interventions  Mom is choosing to use formula since she is concerned about her milk supply.  She said baby was clusterfeeding after eating for 60min and that made her worry that she was not making enough milk.  [facility] intern reviewed the normalcy of clusterfeeding and how breastmilk production works in a supply and demand model.  Mom understood that if she offered the breast more often, hand expressed, or pumped milk then her supply would increase.  Mom has [place] and is interested in renting a pump.  [facility] intern reviewed cleaning guidelines and storage recommendations for pumped milk.  Mom was instructed to offer the breast first, then feed formula while she pumps.  Pumped milk will be given to baby after the next feed.  Mom was reassured that baby was healthy, had not lost a significant amount of weight, and has had appropriate pees/poops.  This made mom more relieved.  Mom was instructed to call Lactation when baby is cuing for the next feed to assess latch and encourage exclusive breastfeeding.  Lactation Tools Discussed/Used WIC Program: Yes Pump Review: Setup, frequency, and cleaning;Milk Storage   Consult Status Consult Status: Follow-up Date: 08/29/18 Follow-up type: In-patient    Madison Jenkins 08/29/2018, 11:42 AM

## 2018-08-29 NOTE — Progress Notes (Signed)
Post Partum Day 2 Subjective: Doing well, no complaints.  Tolerating regular diet, pain with PO meds, voiding and ambulating without difficulty.  No CP SOB Fever,Chills, N/V or leg pain; denies nipple or breast pain; no HA change of vision, RUQ/epigastric pain  Objective: BP 134/86 (BP Location: Right Arm)   Pulse 81   Temp 98.4 F (36.9 C) (Oral)   Resp 20   Ht 5\' 8"  (1.727 m)   Wt 92.1 kg   LMP 12/09/2017   SpO2 100%   Breastfeeding Unknown   BMI 30.87 kg/m    Physical Exam:  General: NAD Breasts: soft/nontender CV: RRR Pulm: nl effort, CTABL Abdomen: soft, NT, BS x 4 Perineum: minimal edema, intact Lochia: small Uterine Fundus: fundus firm and 3 fb below umbilicus DVT Evaluation: no cords, ttp LEs   Recent Labs    08/28/18 0012 08/28/18 0630  HGB 7.1* 8.3*  HCT 22.0* 25.6*  WBC 10.0 10.2  PLT 166 166    Assessment/Plan: 23 y.o. [id number] postpartum day # 2  - Continue routine PP care - Lactation consult prn.  - Discussed contraceptive options including implant, IUDs hormonal and non-hormonal, injection, pills/ring/patch, condoms, and NFP. Desires Pills- discussed timing of pill start no sooner than 1 month PP.  - Acute blood loss anemia - hemodynamically stable and asymptomatic; continue po ferrous sulfate BID with stool softeners  - reviewed PP GHTN vs Pre-eclampsia, and f/u at 1-2wks PP.  - Immunization status:  all Imms up to date   Disposition: Does desire Dc home today.    Francetta Found, CNM 08/29/2018  9:13 AM

## 2018-08-29 NOTE — Progress Notes (Signed)
DC instr reviewed with pt.  Verb u/o of care for at home.  Reviewed red flag warning signs that would need to notify provider on.  Pt verb u.o.

## 2018-08-29 NOTE — Progress Notes (Signed)
[place] has accompanied Madison Jenkins to the hospital [facility] on 08/27/18. She did experience complications with childbirth.  They were discharged on 08/29/18.    Sheela Stack, RN

## 2018-08-30 LAB — TYPE AND SCREEN
ABO/RH(D): A POS
Antibody Screen: NEGATIVE
Unit division: 0
Unit division: 0

## 2018-08-30 LAB — BPAM RBC
Blood Product Expiration Date: 202008312359
Blood Product Expiration Date: 202009222359
ISSUE DATE / TIME: 202008230151
ISSUE DATE / TIME: 202008230224
Unit Type and Rh: 6200
Unit Type and Rh: 6200

## 2018-08-30 LAB — SURGICAL PATHOLOGY

## 2019-04-27 ENCOUNTER — Ambulatory Visit: Payer: Medicaid Other | Attending: Family

## 2019-04-27 DIAGNOSIS — Z20822 Contact with and (suspected) exposure to covid-19: Secondary | ICD-10-CM

## 2019-04-28 LAB — NOVEL CORONAVIRUS, NAA: SARS-CoV-2, NAA: NOT DETECTED

## 2019-04-28 LAB — SARS-COV-2, NAA 2 DAY TAT

## 2019-05-05 ENCOUNTER — Other Ambulatory Visit: Payer: Self-pay

## 2019-05-05 ENCOUNTER — Encounter: Payer: Self-pay | Admitting: Emergency Medicine

## 2019-05-05 ENCOUNTER — Emergency Department: Payer: Medicaid Other

## 2019-05-05 ENCOUNTER — Emergency Department
Admission: EM | Admit: 2019-05-05 | Discharge: 2019-05-05 | Disposition: A | Discharge status: Home / Self Care | Payer: Medicaid Other | Attending: Emergency Medicine | Admitting: Emergency Medicine

## 2019-05-05 DIAGNOSIS — Z91010 Allergy to peanuts: Secondary | ICD-10-CM | POA: Insufficient documentation

## 2019-05-05 DIAGNOSIS — Z87891 Personal history of nicotine dependence: Secondary | ICD-10-CM | POA: Diagnosis not present

## 2019-05-05 DIAGNOSIS — Z79899 Other long term (current) drug therapy: Secondary | ICD-10-CM | POA: Insufficient documentation

## 2019-05-05 DIAGNOSIS — F909 Attention-deficit hyperactivity disorder, unspecified type: Secondary | ICD-10-CM | POA: Diagnosis not present

## 2019-05-05 DIAGNOSIS — R0789 Other chest pain: Secondary | ICD-10-CM | POA: Diagnosis present

## 2019-05-05 DIAGNOSIS — J45909 Unspecified asthma, uncomplicated: Secondary | ICD-10-CM | POA: Diagnosis not present

## 2019-05-05 DIAGNOSIS — Z8616 Personal history of COVID-19: Secondary | ICD-10-CM | POA: Insufficient documentation

## 2019-05-05 DIAGNOSIS — U071 COVID-19: Secondary | ICD-10-CM

## 2019-05-05 LAB — BASIC METABOLIC PANEL
Anion gap: 8 (ref 5–15)
BUN: 13 mg/dL (ref 6–20)
CO2: 26 mmol/L (ref 22–32)
Calcium: 9.4 mg/dL (ref 8.9–10.3)
Chloride: 104 mmol/L (ref 98–111)
Creatinine, Ser: 0.7 mg/dL (ref 0.44–1.00)
GFR calc Af Amer: >60 mL/min (ref >60)
GFR calc non Af Amer: >60 mL/min (ref >60)
Glucose, Bld: 127 mg/dL — ABNORMAL HIGH (ref 70–99)
Potassium: 4 mmol/L (ref 3.5–5.1)
Sodium: 138 mmol/L (ref 135–145)

## 2019-05-05 LAB — CBC
HCT: 40.5 % (ref 36.0–46.0)
Hemoglobin: 13.6 g/dL (ref 12.0–15.0)
MCH: 27.6 pg (ref 26.0–34.0)
MCHC: 33.6 g/dL (ref 30.0–36.0)
MCV: 82.2 fL (ref 80.0–100.0)
Platelets: 245 10*3/uL (ref 150–400)
RBC: 4.93 MIL/uL (ref 3.87–5.11)
RDW: 13.2 % (ref 11.5–15.5)
WBC: 4.4 10*3/uL (ref 4.0–10.5)
nRBC: 0 % (ref 0.0–0.2)

## 2019-05-05 LAB — TROPONIN I (HIGH SENSITIVITY): Troponin I (High Sensitivity): <2 ng/L (ref <18)

## 2019-05-05 MED ORDER — KETOROLAC TROMETHAMINE 30 MG/ML IJ SOLN
30.0000 mg | Freq: Once | INTRAMUSCULAR | Status: AC
  Administered 2019-05-05: 20:00:00 30 mg via INTRAMUSCULAR
  Filled 2019-05-05: qty 1

## 2019-05-05 MED ORDER — SODIUM CHLORIDE 0.9% FLUSH: 3.0000 mL | Freq: Once | INTRAVENOUS | Status: DC

## 2019-05-05 NOTE — ED Notes (Signed)
Blue top and SST sent with lab work

## 2019-05-05 NOTE — ED Provider Notes (Signed)
[facility] Emergency Department Provider Note   ____________________________________________    I have reviewed the triage vital signs and the nursing notes.   HISTORY  Chief Complaint Chest Pain     HPI Madison Jenkins is a 24 y.o. female with history as below who has known COVID-19, symptoms started approximately 10 days ago.  He complains of chest discomfort described as tightness.  Particularly worse at night when she is trying to sleep.  During the day she has no shortness of breath, feels mildly short of breath at night.  Does report body aches occasional chills.  She reports her entire family has COVID-19.  Past Medical History:  Diagnosis Date  . ADD (attention deficit disorder)    ADHD as per pt  . Asthma   . Depression   . Eczema   . Fatty liver   . Headache   . High cholesterol   . Murmur   . Orthostatic hypotension     Patient Active Problem List   Diagnosis Date Noted  . Labor and delivery indication for care or intervention 08/26/2018  . Elevated BP without diagnosis of hypertension 08/25/2018  . Anxiety 07/15/2018  . GERD (gastroesophageal reflux disease) 07/15/2018  . Low-lying placenta 05/27/2018  . Back pain 02/23/2015    Past Surgical History:  Procedure Laterality Date  . ADENOIDECTOMY    . TONSILLECTOMY AND ADENOIDECTOMY      Prior to Admission medications   Medication Sig Start Date End Date Taking? Authorizing Provider  acetaminophen (TYLENOL) 500 MG tablet Take 2 tablets (1,000 mg total) by mouth every 6 (six) hours as needed for mild pain, moderate pain or headache. 08/28/18   [name], CNM  ferrous sulfate 325 (65 FE) MG tablet Take 1 tablet (325 mg total) by mouth 2 (two) times daily with a meal. 08/28/18   [name], CNM  ibuprofen (ADVIL) 600 MG tablet Take 1 tablet (600 mg total) by mouth every 6 (six) hours as needed for mild pain or moderate pain. 08/28/18   [name], CNM  POT  BICARB-POT CHLORIDE,25MEQ, 25 MEQ TBEF Dissolve one effervescent tablet completely in 3-4 ounces of cold water. Take with a meal and sip slowly over a 5-10 minute period. 08/28/18   [name], CNM  Prenatal Vit-Fe Fumarate-FA (PRENATAL MULTIVITAMIN) TABS tablet Take 1 tablet by mouth daily at 12 noon.    [provider]  vitamin C (VITAMIN C) 500 MG tablet Take 1 tablet (500 mg total) by mouth 2 (two) times daily with a meal. 08/28/18   [name], CNM     Allergies Peanut-containing drug products, Penicillins, Shellfish allergy, Sulfa antibiotics, and Amoxicillin  Family History  Problem Relation Age of Onset  . Breast cancer Paternal Grandmother   . Cancer - Other Mother        ovarian  . Thyroid disease Mother   . Lupus Mother   . Graves' disease Mother     Social History Social History   Tobacco Use  . Smoking status: Former Smoker    Packs/day: 0.50    Years: 0.50    Pack years: 0.25    Quit date: 05/06/2015    Years since quitting: 4.0  . Smokeless tobacco: Former Network engineer Use Topics  . Alcohol use: Not Currently    Comment: occas  . Drug use: No    Review of Systems  Constitutional: As above Eyes: No visual changes.  ENT: No sore throat. Cardiovascular: As  above Respiratory: As above Gastrointestinal: No abdominal pain.   Genitourinary: Negative for dysuria. Musculoskeletal: Negative for back pain. Skin: Negative for rash. Neurological: Positive headache   ____________________________________________   PHYSICAL EXAM:  VITAL SIGNS: ED Triage Vitals  Enc Vitals Group     BP 05/05/19 1829 (!) 115/100     Pulse Rate 05/05/19 1829 (!) 110     Resp 05/05/19 1829 16     Temp 05/05/19 1829 98.3 F (36.8 C)     Temp Source 05/05/19 1829 Oral     SpO2 05/05/19 1829 100 %     Weight 05/05/19 1829 83 kg (183 lb)     Height 05/05/19 1829 1.753 m (5\' 9" )     Head Circumference --      Peak Flow --      Pain Score 05/05/19 1834  0     Pain Loc --      Pain Edu? --      Excl. in GC? --     Constitutional: Alert and oriented.  Nose: No congestion/rhinnorhea.    Neck:  Painless ROM Cardiovascular: Tachycardia, regular rhythm.  Good peripheral circulation. Respiratory: Normal respiratory effort.  No retractions.   Musculoskeletal: No lower extremity tenderness nor edema.  Warm and well perfused Neurologic:  Normal speech and language. No gross focal neurologic deficits are appreciated.  Skin:  Skin is warm, dry and intact. No rash noted. Psychiatric: Mood and affect are normal. Speech and behavior are normal.  ____________________________________________   LABS (all labs ordered are listed, but only abnormal results are displayed)  Labs Reviewed  BASIC METABOLIC PANEL - Abnormal; Notable for the following components:      Result Value   Glucose, Bld 127 (*)    All other components within normal limits  CBC  TROPONIN I (HIGH SENSITIVITY)   ____________________________________________  EKG  ED ECG REPORT I, 05/07/19, the attending physician, personally viewed and interpreted this ECG.  Date: 05/05/2019  Rhythm: Sinus tachycardia QRS Axis: normal Intervals: normal ST/T Wave abnormalities: normal Narrative Interpretation: no evidence of acute ischemia  ____________________________________________  RADIOLOGY  Chest x-ray reviewed by me, possible opacities in the lower lungs ____________________________________________   PROCEDURES  Procedure(s) performed: No  Procedures   Critical Care performed: No ____________________________________________   INITIAL IMPRESSION / ASSESSMENT AND PLAN / ED COURSE  Pertinent labs & imaging results that were available during my care of the patient were reviewed by me and considered in my medical decision making (see chart for details).  Patient with known COVID-19 presents with chest tightness and discomfort.  Vitals are overall reassuring,  oxygen saturation on room air is 99 to 100%.  I suspect her chest discomfort is related to COVID-19 virus.  No pleurisy to suggest PE.  Troponin is undetectable which makes ACS myocarditis pericarditis very unlikely   Lab work is otherwise reassuring.  We will treat with IM Toradol for body aches and chest discomfort and recommend supportive care.    ____________________________________________   FINAL CLINICAL IMPRESSION(S) / ED DIAGNOSES  Final diagnoses:  Atypical chest pain  COVID-19        Note:  This document was prepared using Dragon voice recognition software and may include unintentional dictation errors.   05/07/2019, MD 05/05/19 05/07/19

## 2019-05-05 NOTE — ED Triage Notes (Signed)
Pt presents to ED c/o sharp mid-sternal chest pain that radiates to back with inspiration and tachycardia. Dx Covid+ 04/2819.

## 2019-05-05 NOTE — ED Notes (Signed)
Patient reports went to [facility] on Wednesday due to chest discomfort and was diagnosed with COVID.  Patient reports continued chest discomfort, pain increased with deep breathing and cough but occasionally while at rest.  Patient with no obvious respiratory distress noted on assessment.

## 2019-05-30 ENCOUNTER — Other Ambulatory Visit: Payer: Self-pay

## 2019-05-30 ENCOUNTER — Other Ambulatory Visit
Admission: RE | Admit: 2019-05-30 | Discharge: 2019-05-30 | Disposition: A | Discharge status: Home / Self Care | Payer: Medicaid Other | Source: Ambulatory Visit | Attending: Cardiovascular Disease | Admitting: Cardiovascular Disease

## 2019-05-30 ENCOUNTER — Encounter: Payer: Self-pay | Admitting: Cardiovascular Disease

## 2019-05-30 ENCOUNTER — Ambulatory Visit (INDEPENDENT_AMBULATORY_CARE_PROVIDER_SITE_OTHER): Payer: Medicaid Other | Admitting: Cardiovascular Disease

## 2019-05-30 VITALS — BP 114/72 | HR 88 | Ht 69.0 in | Wt 185.0 lb

## 2019-05-30 DIAGNOSIS — R0602 Shortness of breath: Secondary | ICD-10-CM | POA: Insufficient documentation

## 2019-05-30 DIAGNOSIS — R079 Chest pain, unspecified: Secondary | ICD-10-CM | POA: Diagnosis present

## 2019-05-30 DIAGNOSIS — R002 Palpitations: Secondary | ICD-10-CM

## 2019-05-30 LAB — FIBRIN DERIVATIVES D-DIMER (ARMC ONLY): Fibrin derivatives D-dimer (ARMC): 204.18 ng/mL (FEU) (ref 0.00–499.00)

## 2019-05-30 LAB — TROPONIN I (HIGH SENSITIVITY): Troponin I (High Sensitivity): <2 ng/L (ref <18)

## 2019-05-30 LAB — C-REACTIVE PROTEIN: CRP: 0.6 mg/dL (ref <1.0)

## 2019-05-30 LAB — SEDIMENTATION RATE: Sed Rate: 6 mm/hr (ref 0–20)

## 2019-05-30 NOTE — Patient Instructions (Signed)
Medication Instructions:  Your physician recommends that you continue on your current medications as directed. Please refer to the Current Medication list given to you today.  *If you need a refill on your cardiac medications before your next appointment, please call your pharmacy*   Lab Work: Your physician recommends that you return for lab work in: TODAY at you leave. - TROPONIN, D-DIMER, SED RATE, CRP. - [facility] to the [facility]. You will check in at the front desk to the right as you walk into the atrium. Valet Parking is offered if needed. - No appointment needed. You may go any day between 7 am and 6 pm.  If you have labs (blood work) drawn today and your tests are completely normal, you will receive your results only by: Marland Kitchen MyChart Message (if you have MyChart) OR . A paper copy in the mail If you have any lab test that is abnormal or we need to change your treatment, we will call you to review the results.   Testing/Procedures: Your physician has requested that you have an echocardiogram. Echocardiography is a painless test that uses sound waves to create images of your heart. It provides your doctor with information about the size and shape of your heart and how well your heart's chambers and valves are working. This procedure takes approximately one hour. There are no restrictions for this procedure. You may get an IV, if needed, to receive an ultrasound enhancing agent through to better visualize your heart.    Follow-Up: At [facility], you and your health needs are our priority.  As part of our continuing mission to provide you with exceptional heart care, we have created designated Provider Care Teams.  These Care Teams include your primary Cardiologist (physician) and Advanced Practice Providers (APPs -  Physician Assistants and Nurse Practitioners) who all work together to provide you with the care you need, when you need it.  We recommend signing up for the  patient portal called "MyChart".  Sign up information is provided on this After Visit Summary.  MyChart is used to connect with patients for Virtual Visits (Telemedicine).  Patients are able to view lab/test results, encounter notes, upcoming appointments, etc.  Non-urgent messages can be sent to your provider as well.   To learn more about what you can do with MyChart, go to [email or web address].    Your next appointment:   1 month(s)  The format for your next appointment:   In Person  Provider:    You may see Dr Kathlyn Sacramento or one of the following Advanced Practice Providers on your designated Care Team:    Murray Hodgkins, NP  Christell Faith, PA-C  Marrianne Mood, PA-C   Echocardiogram An echocardiogram is a procedure that uses painless sound waves (ultrasound) to produce an image of the heart. Images from an echocardiogram can provide important information about:  Signs of coronary artery disease (CAD).  Aneurysm detection. An aneurysm is a weak or damaged part of an artery wall that bulges out from the normal force of blood pumping through the body.  Heart size and shape. Changes in the size or shape of the heart can be associated with certain conditions, including heart failure, aneurysm, and CAD.  Heart muscle function.  Heart valve function.  Signs of a past heart attack.  Fluid buildup around the heart.  Thickening of the heart muscle.  A tumor or infectious growth around the heart valves. Tell a health care provider about:  Any  allergies you have.  All medicines you are taking, including vitamins, herbs, eye drops, creams, and over-the-counter medicines.  Any blood disorders you have.  Any surgeries you have had.  Any medical conditions you have.  Whether you are pregnant or may be pregnant. What are the risks? Generally, this is a safe procedure. However, problems may occur, including:  Allergic reaction to dye (contrast) that may be used during  the procedure. What happens before the procedure? No specific preparation is needed. You may eat and drink normally. What happens during the procedure?   An IV tube may be inserted into one of your veins.  You may receive contrast through this tube. A contrast is an injection that improves the quality of the pictures from your heart.  A gel will be applied to your chest.  A wand-like tool (transducer) will be moved over your chest. The gel will help to transmit the sound waves from the transducer.  The sound waves will harmlessly bounce off of your heart to allow the heart images to be captured in real-time motion. The images will be recorded on a computer. The procedure may vary among health care providers and hospitals. What happens after the procedure?  You may return to your normal, everyday life, including diet, activities, and medicines, unless your health care provider tells you not to do that. Summary  An echocardiogram is a procedure that uses painless sound waves (ultrasound) to produce an image of the heart.  Images from an echocardiogram can provide important information about the size and shape of your heart, heart muscle function, heart valve function, and fluid buildup around your heart.  You do not need to do anything to prepare before this procedure. You may eat and drink normally.  After the echocardiogram is completed, you may return to your normal, everyday life, unless your health care provider tells you not to do that. This information is not intended to replace advice given to you by your health care provider. Make sure you discuss any questions you have with your health care provider. Document Revised: 04/14/2018 Document Reviewed: 01/25/2016 Elsevier Patient Education  2020 [employer].

## 2019-05-30 NOTE — Progress Notes (Signed)
Cardiology Office Note   Date:  05/30/2019   ID:  Madison Jenkins, DOB 08-02-1995, MRN [id number]  PCP:  Leanna Sato, MD  Cardiologist:   Lorine Bears, MD   Chief Complaint  Patient presents with  . New Patient (Initial Visit)    referred by PCP for Worsening chest pain Since covid and swelling in ankles at night. meds reviewed verbally with patient.       History of Present Illness: Madison Jenkins is a 24 y.o. female who was referred by [facility] ED for evaluation of chest pain.    She has history of ADHD, asthma, depression, orthostatic hypotension and reported cardiac murmur. She reports having symptoms of dizziness and syncope when she was in high school.  She was referred to cardiology and underwent an echocardiogram which showed normal LV systolic function and no significant valvular abnormalities.  She reports being seen by a cardiologist at that time and was told that she had orthostatic hypotension.  She has no recurrent episodes recently.  She has known history of elevated heart rate most of the time sinus tachycardia and she does seem to feel some palpitation.  In addition, she reports chronic chest pain of unclear etiology.  She was diagnosed with COVID-19 infection in late March which manifested by loss of smell and taste, body aches and chest pain that started few days after.  The chest pain is mostly sharp that gets worse with taking a deep breath and when lying down.  Its worse at night.  She started having substernal tightness in addition.  She is also more short of breath.  She had an emergency room visit last month [facility] with chest pain.  Troponin was normal.  EKG showed sinus tachycardia with anterolateral T wave changes.  Chest x-ray showed minor bibasilar opacities likely atelectasis versus Covid pneumonia.   Past Medical History:  Diagnosis Date  . ADD (attention deficit disorder)    ADHD as per pt  . Asthma   . Depression   . Eczema   . Fatty liver   .  Headache   . High cholesterol   . Murmur   . Orthostatic hypotension     Past Surgical History:  Procedure Laterality Date  . ADENOIDECTOMY    . TONSILLECTOMY AND ADENOIDECTOMY       Current Outpatient Medications  Medication Sig Dispense Refill  . acetaminophen (TYLENOL) 500 MG tablet Take 2 tablets (1,000 mg total) by mouth every 6 (six) hours as needed for mild pain, moderate pain or headache. 60 tablet 0  . ibuprofen (ADVIL) 600 MG tablet Take 1 tablet (600 mg total) by mouth every 6 (six) hours as needed for mild pain or moderate pain. 60 tablet 0   No current facility-administered medications for this visit.    Allergies:   Peanut-containing drug products, Penicillins, Shellfish allergy, Sulfa antibiotics, and Amoxicillin    Social History:  The patient  reports that she quit smoking about 4 years ago. She has a 0.25 pack-year smoking history. She has quit using smokeless tobacco. She reports previous alcohol use. She reports that she does not use drugs.   Family History:  The patient's family history includes Breast cancer in her paternal grandmother; Cancer - Other in her mother; Luiz Blare' disease in her mother; Lupus in her mother; Thyroid disease in her mother.    ROS:  Please see the history of present illness.   Otherwise, review of systems are positive for none.  All other systems are reviewed and negative.    PHYSICAL EXAM: VS:  BP 114/72 (BP Location: Left Arm, Patient Position: Sitting, Cuff Size: Normal)   Pulse 88   Ht 5\' 9"  (1.753 m)   Wt 185 lb (83.9 kg)   SpO2 98%   BMI 27.32 kg/m  , BMI Body mass index is 27.32 kg/m. GEN: Well nourished, well developed, in no acute distress  HEENT: normal  Neck: no JVD, carotid bruits, or masses Cardiac: RRR; no murmurs, rubs, or gallops,no edema  Respiratory:  clear to auscultation bilaterally, normal work of breathing GI: soft, nontender, nondistended, + BS MS: no deformity or atrophy  Skin: warm and dry, no  rash Neuro:  Strength and sensation are intact Psych: euthymic mood, full affect   EKG:  EKG is ordered today. The ekg ordered today demonstrates normal sinus rhythm with sinus arrhythmia and nonspecific T wave changes.   Recent Labs: 08/28/2018: ALT 10 05/05/2019: BUN 13; Creatinine, Ser 0.70; Hemoglobin 13.6; Platelets 245; Potassium 4.0; Sodium 138    Lipid Panel No results found for: CHOL, TRIG, HDL, CHOLHDL, VLDL, LDLCALC, LDLDIRECT    Wt Readings from Last 3 Encounters:  05/30/19 185 lb (83.9 kg)  05/05/19 183 lb (83 kg)  08/26/18 203 lb (92.1 kg)       No flowsheet data found.    ASSESSMENT AND PLAN:  1.  Pleuritic chest pain and shortness of breath: The patient reports that these are chronic symptoms for her but got worse after her diagnosis of COVID-19 in late March.  Obviously, there are reported cases of pericarditis and myocarditis with COVID-19.  I am going to obtain an echocardiogram especially that her symptoms persisted.  I will also check sedimentation rate and C-reactive protein. In addition, I do think we have to exclude the remote possibility of pulmonary embolism and thus I requested D-dimer.  2.  Sinus tachycardia and palpitations: I will consider treatment with a small dose beta-blocker if symptoms persist.   Disposition:   FU with me in 1 month  Signed,  Kathlyn Sacramento, MD  05/30/2019 2:05 PM    [place]

## 2019-06-30 ENCOUNTER — Encounter: Payer: Self-pay | Admitting: Family

## 2019-06-30 ENCOUNTER — Ambulatory Visit (INDEPENDENT_AMBULATORY_CARE_PROVIDER_SITE_OTHER): Payer: Medicaid Other | Admitting: Family

## 2019-06-30 VITALS — BP 100/72 | HR 98 | Ht 69.0 in | Wt 190.0 lb

## 2019-06-30 DIAGNOSIS — R0602 Shortness of breath: Secondary | ICD-10-CM | POA: Diagnosis not present

## 2019-06-30 DIAGNOSIS — R002 Palpitations: Secondary | ICD-10-CM

## 2019-06-30 DIAGNOSIS — R000 Tachycardia, unspecified: Secondary | ICD-10-CM | POA: Diagnosis not present

## 2019-06-30 DIAGNOSIS — R079 Chest pain, unspecified: Secondary | ICD-10-CM | POA: Diagnosis not present

## 2019-06-30 MED ORDER — METOPROLOL SUCCINATE ER 25 MG PO TB24
12.5000 mg | ORAL_TABLET | Freq: Every day | ORAL | 2 refills | Status: DC
Start: 2019-06-30 — End: 2020-04-04

## 2019-06-30 NOTE — Patient Instructions (Addendum)
Medication Instructions:  Your physician has recommended you make the following change in your medication:   START Metoprolol 12.5mg  (half tablet) daily  *If you need a refill on your cardiac medications before your next appointment, please call your pharmacy*  Lab Work: No lab work today  If you have labs (blood work) drawn today and your tests are completely normal, you will receive your results only by: Marland Kitchen MyChart Message (if you have MyChart) OR . A paper copy in the mail If you have any lab test that is abnormal or we need to change your treatment, we will call you to review the results.  Testing/Procedures: Your EKG today shows normal sinus rhythm.  Follow-Up: At [facility], you and your health needs are our priority.  As part of our continuing mission to provide you with exceptional heart care, we have created designated Provider Care Teams.  These Care Teams include your primary Cardiologist (physician) and Advanced Practice Providers (APPs -  Physician Assistants and Nurse Practitioners) who all work together to provide you with the care you need, when you need it.  We recommend signing up for the patient portal called "MyChart".  Sign up information is provided on this After Visit Summary.  MyChart is used to connect with patients for Virtual Visits (Telemedicine).  Patients are able to view lab/test results, encounter notes, upcoming appointments, etc.  Non-urgent messages can be sent to your provider as well.   To learn more about what you can do with MyChart, go to [email or web address].    Your next appointment:   1 month(s)  The format for your next appointment:   In Person  Provider:   You may see Kathlyn Sacramento, MD or one of the following Advanced Practice Providers on your designated Care Team:    Murray Hodgkins, NP  Christell Faith, PA-C  Marrianne Mood, PA-C  Laurann Montana, NP  Other Instructions  Metoprolol Extended-Release Capsules What is this  medicine? METOPROLOL (me TOE proe lole) is a beta blocker. It decreases the amount of work your heart has to do and helps your heart beat regularly. It treats high blood pressure and/or prevents chest pain (also called angina). It also treats heart failure. This medicine may be used for other purposes; ask your health care provider or pharmacist if you have questions. COMMON BRAND NAME(S): KAPSPARGO What should I tell my health care provider before I take this medicine? They need to know if you have any of these conditions:  diabetes  heart disease  liver disease  lung or breathing disease, like asthma  pheochromocytoma  thyroid disease  an unusual or allergic reaction to metoprolol, other beta-blockers, medicines, foods, dyes, or preservatives  pregnant or trying to get pregnant  breast-feeding How should I use this medicine? Take this drug by mouth with water. Take it as directed on the prescription label at the same time every day. Do not cut, crush or chew this drug. Swallow the capsules whole. You may open the capsule and put the contents in 1 teaspoon of applesauce. Swallow the drug and applesauce right away. Do not chew the drug or applesauce. Keep taking it unless your health care provider tells you to stop. Talk to your health care provider about the use of this drug in children. While it may be prescribed for children as young as 6 for selected conditions, precautions do apply. Overdosage: If you think you have taken too much of this medicine contact a poison control center or emergency room  at once. NOTE: This medicine is only for you. Do not share this medicine with others. What if I miss a dose? If you miss a dose, take it as soon as you can. If it is almost time for your next dose, take only that dose. Do not take double or extra doses. What may interact with this medicine? This medicine may interact with the following medications:  certain medicines for blood  pressure, heart disease, irregular heart beat  epinephrine  fluoxetine  MAOIs like Carbex, Eldepryl, Marplan, Nardil, and Parnate  paroxetine  reserpine This list may not describe all possible interactions. Give your health care provider a list of all the medicines, herbs, non-prescription drugs, or dietary supplements you use. Also tell them if you smoke, drink alcohol, or use illegal drugs. Some items may interact with your medicine. What should I watch for while using this medicine? You may get drowsy or dizzy. Do not drive, use machinery, or do anything that needs mental alertness until you know how this medicine affects you. Do not stand or sit up quickly, especially if you are an older patient. This reduces the risk of dizzy or fainting spells. Alcohol may interfere with the effect of this medicine. Avoid alcoholic drinks. Visit your doctor or health care professional for regular checks on your progress. Check your blood pressure as directed. Ask your doctor or health care professional what your blood pressure should be and when you should contact him or her. Do not treat yourself for coughs, colds, or pain while you are using this medicine without asking your doctor or health care professional for advice. Some ingredients may increase your blood pressure. This medicine may increase blood sugar. Ask your healthcare provider if changes in diet or medicines are needed if you have diabetes. What side effects may I notice from receiving this medicine? Side effects that you should report to your doctor or health care professional as soon as possible:  allergic reactions like skin rash, itching or hives, swelling of the face, lips, or tongue  cold hands or feet  signs and symptoms of high blood sugar such as being more thirsty or hungry or having to urinate more than normal. You may also feel very tired or have blurry vision.  signs and symptoms of low blood pressure like dizziness; feeling  faint or lightheaded, falls; unusually weak or tired  signs of worsening heart failure like breathing problems, swelling in your legs and feet  suicidal thoughts or other mood changes  unusually slow heartbeat Side effects that usually do not require medical attention (report these to your doctor or health care professional if they continue or are bothersome):  anxious  change in sex drive or performance  diarrhea  headache  trouble sleeping  upset stomach This list may not describe all possible side effects. Call your doctor for medical advice about side effects. You may report side effects to FDA at 1-800-FDA-1088. Where should I keep my medicine? Keep out of the reach of children and pets. Store at room temperature between 20 and 25 degrees C (68 and 77 degrees F). Throw away any unused drug after the expiration date. NOTE: This sheet is a summary. It may not cover all possible information. If you have questions about this medicine, talk to your doctor, pharmacist, or health care provider.  2020 Elsevier/Gold Standard (2018-09-21 13:24:03)   Palpitations Palpitations are feelings that your heartbeat is not normal. Your heartbeat may feel like it is:  Uneven.  Faster  than normal.  Fluttering.  Skipping a beat. This is usually not a serious problem. In some cases, you may need tests to rule out any serious problems. Follow these instructions at home: Pay attention to any changes in your condition. Take these actions to help manage your symptoms: Eating and drinking  Avoid: ? Coffee, tea, soft drinks, and energy drinks. ? Chocolate. ? Alcohol. ? Diet pills. Lifestyle   Try to lower your stress. These things can help you relax: ? Yoga. ? Deep breathing and meditation. ? Exercise. ? Using words and images to create positive thoughts (guided imagery). ? Using your mind to control things in your body (biofeedback).  Do not use drugs.  Get plenty of rest and  sleep. Keep a regular bed time. General instructions   Take over-the-counter and prescription medicines only as told by your doctor.  Do not use any products that contain nicotine or tobacco, such as cigarettes and e-cigarettes. If you need help quitting, ask your doctor.  Keep all follow-up visits as told by your doctor. This is important. You may need more tests if palpitations do not go away or get worse. Contact a doctor if:  Your symptoms last more than 24 hours.  Your symptoms occur more often. Get help right away if you:  Have chest pain.  Feel short of breath.  Have a very bad headache.  Feel dizzy.  Pass out (faint). Summary  Palpitations are feelings that your heartbeat is uneven or faster than normal. It may feel like your heart is fluttering or skipping a beat.  Avoid food and drinks that may cause palpitations. These include caffeine, chocolate, and alcohol.  Try to lower your stress. Do not smoke or use drugs.  Get help right away if you faint or have chest pain, shortness of breath, a severe headache, or dizziness. This information is not intended to replace advice given to you by your health care provider. Make sure you discuss any questions you have with your health care provider. Document Revised: 02/03/2017 Document Reviewed: 02/03/2017 Elsevier Patient Education  2020 [employer].

## 2019-06-30 NOTE — Progress Notes (Signed)
Office Visit    Patient Name: Madison Jenkins Date of Encounter: 06/30/2019  Primary Care Provider:  Leanna Sato, MD Primary Cardiologist:  Lorine Bears, MD Electrophysiologist:  None   Chief Complaint    Madison Jenkins is a 24 y.o. female with a hx of ADHD, asthma, depression, orthostatic hypotension, reported cardiac murmur, COVID-19 infection 03/2019, chest pain presents today for follow up of palpitations/tachycardia.  Past Medical History    Past Medical History:  Diagnosis Date   ADD (attention deficit disorder)    ADHD as per pt   Asthma    Depression    Eczema    Fatty liver    Headache    High cholesterol    Murmur    Orthostatic hypotension    Past Surgical History:  Procedure Laterality Date   ADENOIDECTOMY     TONSILLECTOMY AND ADENOIDECTOMY      Allergies  Allergies  Allergen Reactions   Peanut-Containing Drug Products    Penicillins Anaphylaxis   Shellfish Allergy Hives and Shortness Of Breath   Sulfa Antibiotics Anaphylaxis   Amoxicillin Rash    History of Present Illness    Madison Jenkins is a 24 y.o. female with a hx of DHD, asthma, depression, orthostatic hypotension, reported cardiac murmur, COVID-19 infection 03/2019, chest pain last seen by Dr. Kirke Corin 05/28/2019.  Previous cardiac work-up for orthostatic hypotension, dizziness, syncope when in high school.  Her echocardiogram at that time showed normal LV systolic function no significant valvular abnormalities.  At last visit she noted longstanding history of tachycardia and sensation of some palpitations.  She reported chronic chest pain of unclear etiology.  She was diagnosed with COVID-19 in March 2021 manifested by loss of smell, taste, body aches, chest pain.  Chest pain was noted to be pleuritic in place with deep breathing.  [facility] ED visit 05/05/2019 with chest pain.  Troponin was normal.  EKG showed sinus tachycardia with anterolateral T wave changes.  CXR with minor  bibasilar opacities likely atelectasis versus Covid pneumonia.  She was recommended for echocardiogram due to pleuritic chest pain and shortness of breath that were worsening after diagnosis of COVID-19 in late March.  She had lab work including normal high-sensitivity troponin, CRP 0.6, sed rate 6, D-dimer 2 of 4.18 which were all within normal ranges.  She has 2 children at home a 3-year-old daughter and a 31 year old son.  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  Reports continued symptoms.  She endorses with shortness of breath when lying down and waking up in the middle night feeling short of breath.  Tells me this has been got going on for many years and is unchanged.  She continues to get chest pain when she takes a deep breath.  She has intermittent lower extremity edema that resolves when she puts her feet up.  We discussed compression stockings.  Gets very sharp pains in her chest when she lays on her left side.   Endorses tachycardia and lightheadedness when she stands up.  She does drink 5 large water bottles per day-likely greater than 2 L of fluid intake per day.  She also drinks 2 bottles of green tea per day.  She does not drink any caffeinated beverages.  Her blood pressure when checked at home is 110s/80s.   EKGs/Labs/Other Studies Reviewed:   The following studies were reviewed today:  EKG:  EKG is ordered  today.  The ekg ordered today demonstrates normal sinus rhythm 98 bpm with nonspecific T wave changes.  Recent Labs: 08/28/2018: ALT 10 05/05/2019: BUN 13; Creatinine, Ser 0.70; Hemoglobin 13.6; Platelets 245; Potassium 4.0; Sodium 138  Recent Lipid Panel No results found for: CHOL, TRIG, HDL, CHOLHDL, VLDL, LDLCALC, LDLDIRECT  Home Medications   Current Meds  Medication Sig   Multiple Vitamin (MULTIVITAMIN) capsule Take 1 capsule by mouth daily.   Review of Systems      Review of Systems   Constitutional: Negative for chills, fever and malaise/fatigue.  Cardiovascular: Positive for palpitations. Negative for chest pain, dyspnea on exertion, leg swelling, near-syncope, orthopnea and syncope.  Respiratory: Negative for cough, shortness of breath and wheezing.   Gastrointestinal: Negative for nausea and vomiting.  Neurological: Positive for dizziness and light-headedness. Negative for weakness.   All other systems reviewed and are otherwise negative except as noted above.  Physical Exam    VS:  BP 100/72 (BP Location: Left Arm, Patient Position: Sitting, Cuff Size: Normal)    Pulse 98    Ht 5\' 9"  (1.753 m)    Wt 190 lb (86.2 kg)    SpO2 98%    BMI 28.06 kg/m  , BMI Body mass index is 28.06 kg/m. GEN: Well nourished, well developed, in no acute distress. HEENT: normal. Neck: Supple, no JVD, carotid bruits, or masses. Cardiac: RRR, no murmurs, rubs, or gallops. No clubbing, cyanosis, edema.  Radials/DP/PT 2+ and equal bilaterally.  Respiratory:  Respirations regular and unlabored, clear to auscultation bilaterally. GI: Soft, nontender, nondistended, BS + x 4. MS: No deformity or atrophy. Skin: Warm and dry, no rash. Neuro:  Strength and sensation are intact. Psych: Normal affect.  Assessment & Plan    1. Pleuritic chest pain and shortness of breath -reports continued chest pain with a deep breath, pleuritic.  Tells me these are chronic symptoms and got worse after her diagnosis of COVID-19 in late March.  Feels they have back to her baseline pre-Covid.  She had normal sed rate, CRP, d-dimer. Recent CBC with no evidence of anemia. Previously ordered echocardiogram scheduled for next week.   2. Sinus tachycardia/palpitations -continues to have symptoms of tachycardia with position changes and palpitations.  She often has palpitations when laying on her left side at night.  We discussed possible etiology PVC and recommend sleeping on her back or right side.  We discussed  continued symptom management via hydration, avoidance of caffeine, slow position changes, compression stockings versus addition of low-dose beta-blocker.  She prefers to try low-dose beta-blocker at this time.  Start Toprol 12.5 mg daily.  She presently has an IUD and we discussed need for continued birth control while on Toprol. Could consider ZIO monitor in the future however, we will defer as she has upcoming echocardiogram and do not want to interfere with testing.  Disposition: Follow up in 1 month(s) with Dr. Fletcher Anon or APP.    Loel Dubonnet, NP 06/30/2019, 4:44 PM

## 2019-07-06 ENCOUNTER — Other Ambulatory Visit: Payer: Self-pay

## 2019-07-06 ENCOUNTER — Ambulatory Visit (INDEPENDENT_AMBULATORY_CARE_PROVIDER_SITE_OTHER): Payer: Medicaid Other

## 2019-07-06 DIAGNOSIS — R079 Chest pain, unspecified: Secondary | ICD-10-CM

## 2019-07-06 DIAGNOSIS — R0602 Shortness of breath: Secondary | ICD-10-CM | POA: Diagnosis not present

## 2019-07-25 ENCOUNTER — Other Ambulatory Visit: Payer: Self-pay | Admitting: *Deleted

## 2019-08-03 ENCOUNTER — Encounter: Payer: Self-pay | Admitting: Family

## 2019-08-03 ENCOUNTER — Ambulatory Visit (INDEPENDENT_AMBULATORY_CARE_PROVIDER_SITE_OTHER): Payer: Medicaid Other | Admitting: Family

## 2019-08-03 ENCOUNTER — Other Ambulatory Visit: Payer: Self-pay

## 2019-08-03 VITALS — BP 112/86 | HR 91 | Ht 69.0 in | Wt 187.8 lb

## 2019-08-03 DIAGNOSIS — R55 Syncope and collapse: Secondary | ICD-10-CM | POA: Diagnosis not present

## 2019-08-03 DIAGNOSIS — R42 Dizziness and giddiness: Secondary | ICD-10-CM

## 2019-08-03 DIAGNOSIS — R002 Palpitations: Secondary | ICD-10-CM | POA: Diagnosis not present

## 2019-08-03 DIAGNOSIS — R000 Tachycardia, unspecified: Secondary | ICD-10-CM | POA: Diagnosis not present

## 2019-08-03 NOTE — Progress Notes (Signed)
Office Visit    Patient Name: Madison Jenkins Date of Encounter: 08/03/2019  Primary Care Provider:  Leanna Sato, MD Primary Cardiologist:  Lorine Bears, MD Electrophysiologist:  None   Chief Complaint    Madison Jenkins is a 24 y.o. female with a hx of ADHD, asthma, depression, orthostatic hypotension, reported cardiac murmur, COVID-19 infection 03/2019, chest pain presents today for follow up of palpitations/tachycardia.  Past Medical History    Past Medical History:  Diagnosis Date  . ADD (attention deficit disorder)    ADHD as per pt  . Asthma   . Depression   . Eczema   . Fatty liver   . Headache   . High cholesterol   . Murmur   . Orthostatic hypotension    Past Surgical History:  Procedure Laterality Date  . ADENOIDECTOMY    . TONSILLECTOMY AND ADENOIDECTOMY      Allergies  Allergies  Allergen Reactions  . Peanut-Containing Drug Products   . Penicillins Anaphylaxis  . Shellfish Allergy Hives and Shortness Of Breath  . Sulfa Antibiotics Anaphylaxis  . Amoxicillin Rash    History of Present Illness    Madison Jenkins is a 24 y.o. female with a hx of DHD, asthma, depression, orthostatic hypotension, reported cardiac murmur, COVID-19 infection 03/2019, chest pain last seen 06/30/19.  Previous cardiac work-up for orthostatic hypotension, dizziness, syncope when in high school.  Her echocardiogram in 2014 showed normal LV systolic function no significant valvular abnormalities.  She reports she wore a monitor.  She has never had tilt table test that she recalls.  She was diagnosed with orthostatic hypotension based on 30 point increase in heart rate with change in position on previous documentation in 2014.  She wore Holter monitor in 2014 which per her report was unrevealing but unable to view formal report.  She was diagnosed with COVID-19 in March 2021 manifested by loss of smell, taste, body aches, chest pain.  Chest pain was noted to be pleuritic in place  with deep breathing.  She did have worsening shortness of breath after her COVID-19 infection and echocardiogram was recommended.  [facility] ED visit 05/05/2019 with chest pain.  Troponin was normal.  EKG showed sinus tachycardia with anterolateral T wave changes.  CXR with minor bibasilar opacities likely atelectasis versus Covid pneumonia. She had lab work including normal high-sensitivity troponin, CRP 0.6, sed rate 6, D-dimer 2 of 4.18 which were all within normal ranges.  She has 2 children at home a 20-year-old daughter and a 19 month old son.  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  At last clinic visit due to continued tachycardia and palpitations she was started on Toprol 12.5mg  daily. She has an IUD and need for continued birth control while on Toprol was discussed. She had an echocardiogram 07/06/19 with LVEF 55-60%, no RWMA, RV normal size and function, no significant valvular abnormalities.   She was seen by her OBGYN 07/19/19 for abdominal pain. Her IUD was in good position, UA low suspicion for UTI. She was started on Zoloft due to anxiety as she did not feel her hydroxyzine was working.   Tells me she had an episode of dizziness and lightheadedness and syncope one day last week. Her husband told her she lost consciousness for about 20 minutes per her husband's report. She has lost consciousness a "handful of times" in the  last year per her report. This history dates back to high school.   Tells me she "stays dizzy". This is both a spinning sensation and a lightheadedness.  Tells me she thinks she was dizzy prior to starting Metoprolol. Does note that it occurs most often with position changes. We have previously reviewed orthostatic precautions. She drinks a lot of water and has started drinking electrolytes with no change. She avoids hot showers as the hot water makes her dizzy.  EKGs/Labs/Other Studies  Reviewed:   The following studies were reviewed today:  EKG:  EKG is ordered today.  The ekg ordered today demonstrates normal sinus rhythm 91 bpm with nonspecific T wave changes.  Recent Labs: 08/28/2018: ALT 10 05/05/2019: BUN 13; Creatinine, Ser 0.70; Hemoglobin 13.6; Platelets 245; Potassium 4.0; Sodium 138  Recent Lipid Panel No results found for: CHOL, TRIG, HDL, CHOLHDL, VLDL, LDLCALC, LDLDIRECT  Home Medications   Current Meds  Medication Sig  . acetaminophen (TYLENOL) 500 MG tablet Take 2 tablets (1,000 mg total) by mouth every 6 (six) hours as needed for mild pain, moderate pain or headache.  . ibuprofen (ADVIL) 600 MG tablet Take 1 tablet (600 mg total) by mouth every 6 (six) hours as needed for mild pain or moderate pain.  Marland Kitchen levonorgestrel (LILETTA) 19.5 MCG/DAY IUD IUD by Intrauterine route.  . metoprolol succinate (TOPROL XL) 25 MG 24 hr tablet Take 0.5 tablets (12.5 mg total) by mouth daily.  . Multiple Vitamin (MULTIVITAMIN) capsule Take 1 capsule by mouth daily.  . sertraline (ZOLOFT) 50 MG tablet Take by mouth.   Review of Systems      Review of Systems  Constitutional: Negative for chills, fever and malaise/fatigue.  Cardiovascular: Positive for near-syncope, palpitations and syncope. Negative for chest pain, dyspnea on exertion, leg swelling and orthopnea.  Respiratory: Negative for cough, shortness of breath and wheezing.   Gastrointestinal: Negative for nausea and vomiting.  Neurological: Positive for dizziness and light-headedness. Negative for weakness.   All other systems reviewed and are otherwise negative except as noted above.  Physical Exam    VS:  BP (!) 112/86 (BP Location: Left Arm, Patient Position: Sitting, Cuff Size: Normal)   Pulse 91   Ht 5\' 9"  (1.753 m)   Wt 187 lb 12.8 oz (85.2 kg)   SpO2 98%   BMI 27.73 kg/m  , BMI Body mass index is 27.73 kg/m. GEN: Well nourished, well developed, in no acute distress. HEENT: normal. Neck: Supple,  no JVD, carotid bruits, or masses. Cardiac: RRR, no murmurs, rubs, or gallops. No clubbing, cyanosis, edema.  Radials/DP/PT 2+ and equal bilaterally.  Respiratory:  Respirations regular and unlabored, clear to auscultation bilaterally. GI: Soft, nontender, nondistended, BS + x 4. MS: No deformity or atrophy. Skin: Warm and dry, no rash. Neuro:  Strength and sensation are intact. Psych: Normal affect.  Assessment & Plan   1. Pleuritic chest pain and shortness of breath - Longstanding history of shortness of breath. Reports this is stable at her baseline. Was transiently worse after her COVID infection but has since returned to baseline. Her echocardiogram 07/06/19 showed LVEF 55-60%, RV normal size and function, with no significant valvular abnormalities. No additional workup at this time.  2. Sinus tachycardia/palpitations/Lightheadedness/Dizziness/Syncope and collapse - Continued tachycardia and palpitations. Tells me symptoms were better for one week after starting low dose Metoprolol, but have persisted. She previously wore a monitor in 2014 which she tells me was unrevealing though data unavailable for review.  Ultimately due to  her multiple episodes of syncope over the last year (most recent episode a few weeks ago with loss of consciousness witnessed by her husband) and hx of symptoms since 2014 she may be a good candidate for a loop recorder.  She had a echocardiogram 07/06/19 which was unrevealing. As such, we will refer her to electrophysiology. Consider etiology of POTS vs orthostatic hypotension. Reviewed recommendation for increased sodium/electrolytes and increased fluid intake.   Disposition: Follow up with EP .  Alver Sorrow, NP 08/03/2019, 3:31 PM

## 2019-08-03 NOTE — Patient Instructions (Signed)
Medication Instructions:  No medication changes today.   *If you need a refill on your cardiac medications before your next appointment, please call your pharmacy*   Lab Work: No lab work today.   If you have labs (blood work) drawn today and your tests are completely normal, you will receive your results only by: Marland Kitchen MyChart Message (if you have MyChart) OR . A paper copy in the mail If you have any lab test that is abnormal or we need to change your treatment, we will call you to review the results.   Testing/Procedures: You have been referred to electrophysiology. These are heart doctors that focus on the electrical system of the heart.    Follow-Up: At [facility], you and your health needs are our priority.  As part of our continuing mission to provide you with exceptional heart care, we have created designated Provider Care Teams.  These Care Teams include your primary Cardiologist (physician) and Advanced Practice Providers (APPs -  Physician Assistants and Nurse Practitioners) who all work together to provide you with the care you need, when you need it.  We recommend signing up for the patient portal called "MyChart".  Sign up information is provided on this After Visit Summary.  MyChart is used to connect with patients for Virtual Visits (Telemedicine).  Patients are able to view lab/test results, encounter notes, upcoming appointments, etc.  Non-urgent messages can be sent to your provider as well.   To learn more about what you can do with MyChart, go to [email or web address].    Your next appointment:   With Dr. Graciela Husbands

## 2019-08-07 ENCOUNTER — Telehealth: Payer: Self-pay

## 2019-08-07 DIAGNOSIS — R002 Palpitations: Secondary | ICD-10-CM

## 2019-08-07 NOTE — Telephone Encounter (Signed)
Patient returning call.

## 2019-08-07 NOTE — Telephone Encounter (Signed)
-----   Message from Alver Sorrow, NP sent at 08/07/2019  8:50 AM EDT ----- Regarding: ZIO monitor Hi,  I saw [place] last week and she has an upcoming appointment with Dr. Lalla Brothers of EP. He has requested she wear a 14 day ZIO monitor. Can we call her and mail her one? Indications include tachycardia, palpitations, dyspnea.   I have sent him another message to see if he wants to delay her appt until after the monitor results and will let you know!  Thanks so much,  Alver Sorrow, NP

## 2019-08-07 NOTE — Telephone Encounter (Signed)
Patient returning call    Attempted to push out visit.  Lalla Brothers schedule not released for September.    Copy fwd to scheduling also to fu

## 2019-08-07 NOTE — Telephone Encounter (Signed)
Call to patient to discuss POC from Gillian Shields, NP.    Pt verbalized understanding and has no further questions at this time.    Advised pt to call for any further questions or concerns.  Order placed as advised.   Pt will follow up on scheduling OV with Lalla Brothers.

## 2019-08-07 NOTE — Telephone Encounter (Signed)
-----   Message from Alver Sorrow, NP sent at 08/07/2019  3:28 PM EDT ----- Regarding: Reschedule EP Taneya Conkel assisted to mail monitor today (thank you again!). Talked with Dr. Lalla Brothers, he recommended delaying her EP appointment until monitor results so they have that information to talk about and a more productive office visit. Can you please assist to reschedule? If ZIO was mailed today and she is wearing for 2 weeks - probably a 5 week follow up for turn around time?   Alver Sorrow, NP

## 2019-08-07 NOTE — Telephone Encounter (Signed)
Attempted to call patient. LMTCB 08/07/2019   

## 2019-08-10 ENCOUNTER — Telehealth: Payer: Self-pay | Admitting: Cardiovascular Disease

## 2019-08-10 NOTE — Telephone Encounter (Signed)
Patient calling Patient received heart monitor in the mail  Will be having an EKG done this weekend, would like to know if it would interfere if she had monitor on Please call to discuss

## 2019-08-10 NOTE — Telephone Encounter (Signed)
Spoke with the patient. Adv her that she can have a EKG performed while wearing her zio monitor.  Patient verbalized understanding and has no other questions or concerns at this time.

## 2019-08-16 ENCOUNTER — Institutional Professional Consult (permissible substitution): Payer: Medicaid Other | Admitting: Cardiology

## 2019-08-22 ENCOUNTER — Telehealth: Payer: Self-pay | Admitting: Cardiovascular Disease

## 2019-08-22 NOTE — Telephone Encounter (Signed)
Spoke with the patient  The patiets zio-monitor was placed on 08/07/19. She is past the 14 days. Adv the patient that she should go ahead and remove the monitor and mail it back as previously instructed. Patient verbalized understanding and voiced appreciation for the call back.

## 2019-08-22 NOTE — Telephone Encounter (Signed)
  1. Is this related to a heart monitor you are wearing?  (If the patient says no, please ask     if they are caling about ICD/pacemaker.) yes   2. What is your issue?? Patient says patch is no longer sticking  Should she try to keep it on or go ahead and send it back    Please route to covering RN/CMA/RMA for results. Route to monitor technicians or your monitor tech representative for your site for any technical concerns

## 2019-09-19 DIAGNOSIS — N920 Excessive and frequent menstruation with regular cycle: Secondary | ICD-10-CM | POA: Insufficient documentation

## 2019-09-20 ENCOUNTER — Other Ambulatory Visit: Payer: Self-pay

## 2019-09-20 ENCOUNTER — Ambulatory Visit (INDEPENDENT_AMBULATORY_CARE_PROVIDER_SITE_OTHER): Payer: Medicaid Other | Admitting: Cardiology

## 2019-09-20 VITALS — BP 116/72 | HR 80 | Ht 69.0 in | Wt 192.0 lb

## 2019-09-20 DIAGNOSIS — R000 Tachycardia, unspecified: Secondary | ICD-10-CM | POA: Diagnosis not present

## 2019-09-20 NOTE — Patient Instructions (Addendum)
Medication Instructions:  Your physician recommends that you continue on your current medications as directed. Please refer to the Current Medication list given to you today.  *If you need a refill on your cardiac medications before your next appointment, please call your pharmacy*   Lab Work: None ordered   Testing/Procedures: You may discontinue/stop the monitor you are wearing.  Please mail it back, see instructions below.   Follow-Up: At [facility], you and your health needs are our priority.  As part of our continuing mission to provide you with exceptional heart care, we have created designated Provider Care Teams.  These Care Teams include your primary Cardiologist (physician) and Advanced Practice Providers (APPs -  Physician Assistants and Nurse Practitioners) who all work together to provide you with the care you need, when you need it.   Your next appointment:  * As needed  The format for your next appointment:   In Person  Provider:   Dr. Lalla Brothers   Thank you for choosing [facility]!!     Other Instructions  ZIO XT- Long Term Monitor Instructions    When you are ready to remove patch, follow instructions on last 2 pages of Patient Log Book.  Stick patch monitor onto last page of Patient Log Book.   Place Patient Log Book in [place] box.  Use locking tab on box and tape box closed securely.  The Orange and [employer] has [employer] on it.  Please place in mailbox as soon as possible.  Your physician should have your test results approximately 7 days after the monitor has been mailed back to [facility].   Call [facility] Customer Care at [phone] if you have questions regarding your ZIO XT patch monitor.  Call them immediately if you see an orange light blinking on your monitor.   If your monitor falls off in less than 4 days contact our Monitor department at [phone].  If your monitor becomes loose or falls off after 4 days call [employer] at  [phone] for suggestions on securing your monitor.

## 2019-09-20 NOTE — Progress Notes (Signed)
Electrophysiology Office Note:    Date:  09/20/2019   ID:  Madison Jenkins, DOB 12/14/1995, MRN [id number]  PCP:  Leanna Sato, MD  [facility] Cardiologist:  Lorine Bears, MD  [facility] Electrophysiologist:  None   Referring MD: Alver Sorrow, NP   Chief Complaint: Tachycardia, palpitations  History of Present Illness:    Madison Jenkins is a 24 y.o. female who presents for an evaluation of tachycardia and palpitations and syncope at the request of Gillian Shields, NP.   Past Medical History:  Diagnosis Date  . ADD (attention deficit disorder)    ADHD as per pt  . Asthma   . Depression   . Eczema   . Fatty liver   . Headache   . High cholesterol   . Murmur   . Orthostatic hypotension     Past Surgical History:  Procedure Laterality Date  . ADENOIDECTOMY    . TONSILLECTOMY AND ADENOIDECTOMY      Current Medications: Current Meds  Medication Sig  . acetaminophen (TYLENOL) 500 MG tablet Take 2 tablets (1,000 mg total) by mouth every 6 (six) hours as needed for mild pain, moderate pain or headache.  . ibuprofen (ADVIL) 600 MG tablet Take 1 tablet (600 mg total) by mouth every 6 (six) hours as needed for mild pain or moderate pain.  Marland Kitchen levonorgestrel (LILETTA) 19.5 MCG/DAY IUD IUD by Intrauterine route.  . metoprolol succinate (TOPROL XL) 25 MG 24 hr tablet Take 0.5 tablets (12.5 mg total) by mouth daily.  . Multiple Vitamin (MULTIVITAMIN) capsule Take 1 capsule by mouth daily.  . sertraline (ZOLOFT) 50 MG tablet Take by mouth.     Allergies:   Peanut-containing drug products, Penicillins, Shellfish allergy, Sulfa antibiotics, and Amoxicillin   Social History   Socioeconomic History  . Marital status: Married    Spouse name: [place]   . Number of children: Not on file  . Years of education: Not on file  . Highest education level: Not on file  Occupational History  . Not on file  Tobacco Use  . Smoking status: Former Smoker    Packs/day: 0.50     Years: 0.50    Pack years: 0.25    Quit date: 05/06/2015    Years since quitting: 4.3  . Smokeless tobacco: Current User    Types: Chew  Vaping Use  . Vaping Use: Former  Substance and Sexual Activity  . Alcohol use: Not Currently    Comment: occas  . Drug use: No  . Sexual activity: Yes    Birth control/protection: Pill    Comment: planning pill  Other Topics Concern  . Not on file  Social History Narrative  . Not on file   Social Determinants of Health   Financial Resource Strain:   . Difficulty of Paying Living Expenses: Not on file  Food Insecurity:   . Worried About Programme researcher, broadcasting/film/video in the Last Year: Not on file  . Ran Out of Food in the Last Year: Not on file  Transportation Needs:   . Lack of Transportation (Medical): Not on file  . Lack of Transportation (Non-Medical): Not on file  Physical Activity:   . Days of Exercise per Week: Not on file  . Minutes of Exercise per Session: Not on file  Stress:   . Feeling of Stress : Not on file  Social Connections:   . Frequency of Communication with Friends and Family: Not on file  . Frequency of  Social Gatherings with Friends and Family: Not on file  . Attends Religious Services: Not on file  . Active Member of Clubs or Organizations: Not on file  . Attends Banker Meetings: Not on file  . Marital Status: Not on file     Family History: The patient's family history includes Breast cancer in her paternal grandmother; Cancer - Other in her mother; Luiz Blare' disease in her mother; Lupus in her mother; Thyroid disease in her mother.  ROS:   Please see the history of present illness.    All other systems reviewed and are negative.  EKGs/Labs/Other Studies Reviewed:    The following studies were reviewed today: Echo  07/06/2019 Echo personally reviewed by me EF normal 60%. No significant valvular disease.  EKG:  The ekg ordered today demonstrates normal sinus rhythm.  Recent Labs: 05/05/2019: BUN 13;  Creatinine, Ser 0.70; Hemoglobin 13.6; Platelets 245; Potassium 4.0; Sodium 138  Recent Lipid Panel No results found for: CHOL, TRIG, HDL, CHOLHDL, VLDL, LDLCALC, LDLDIRECT  Physical Exam:    VS:  BP 116/72   Pulse 80   Ht 5\' 9"  (1.753 m)   Wt 192 lb (87.1 kg)   SpO2 98%   BMI 28.35 kg/m      Orthos: Lying 116/72 79 Sitting 112/74 86 Standing 113/75 100 Standing 3 min 121/79 95    Wt Readings from Last 3 Encounters:  09/20/19 192 lb (87.1 kg)  08/03/19 187 lb 12.8 oz (85.2 kg)  06/30/19 190 lb (86.2 kg)     GEN:  Well nourished, well developed in no acute distress HEENT: Normal NECK: No JVD; No carotid bruits LYMPHATICS: No lymphadenopathy CARDIAC: RRR, no murmurs, rubs, gallops RESPIRATORY:  Clear to auscultation without rales, wheezing or rhonchi  ABDOMEN: Soft, non-tender, non-distended MUSCULOSKELETAL:  No edema; No deformity  SKIN: Warm and dry NEUROLOGIC:  Alert and oriented x 3 PSYCHIATRIC:  Normal affect   ASSESSMENT:    1. Tachycardia    PLAN:    In order of problems listed above:  1. Tachycardia Seems to have improved. Off metoprolol she feels better without further lightheadedness/dizziness/syncopal events. Her Hrs today are normal and show normal variability with standing up. I encouraged her to stay off the metoprolol to avoid off-target effects. She has experienced difficulty returning the zio monitor. I will take a look at those results when they have processed to confirm no arrhythmias.  Follow up prn   Medication Adjustments/Labs and Tests Ordered: Current medicines are reviewed at length with the patient today.  Concerns regarding medicines are outlined above.  Orders Placed This Encounter  Procedures  . EKG 12-Lead   No orders of the defined types were placed in this encounter.   Patient Instructions  Medication Instructions:  Your physician recommends that you continue on your current medications as directed. Please refer to the  Current Medication list given to you today.  *If you need a refill on your cardiac medications before your next appointment, please call your pharmacy*   Lab Work: None ordered   Testing/Procedures: You may discontinue/stop the monitor you are wearing.  Please mail it back, see instructions below.   Follow-Up: At [facility], you and your health needs are our priority.  As part of our continuing mission to provide you with exceptional heart care, we have created designated Provider Care Teams.  These Care Teams include your primary Cardiologist (physician) and Advanced Practice Providers (APPs -  Physician Assistants and Nurse Practitioners) who all work  together to provide you with the care you need, when you need it.   Your next appointment:  * As needed  The format for your next appointment:   In Person  Provider:   Dr. Lalla Brothers   Thank you for choosing [facility]!!     Other Instructions  ZIO XT- Long Term Monitor Instructions    When you are ready to remove patch, follow instructions on last 2 pages of Patient Log Book.  Stick patch monitor onto last page of Patient Log Book.   Place Patient Log Book in [place] box.  Use locking tab on box and tape box closed securely.  The [employer] and [employer] has [employer] on it.  Please place in mailbox as soon as possible.  Your physician should have your test results approximately 7 days after the monitor has been mailed back to [facility].   Call [facility] Customer Care at [phone] if you have questions regarding your ZIO XT patch monitor.  Call them immediately if you see an orange light blinking on your monitor.   If your monitor falls off in less than 4 days contact our Monitor department at [phone].  If your monitor becomes loose or falls off after 4 days call [employer] at [phone] for suggestions on securing your monitor.         Signed, Steffanie Dunn, MD, [facility]  09/20/2019 12:08 PM      Electrophysiology [facility]

## 2020-03-25 ENCOUNTER — Encounter: Payer: Self-pay | Admitting: *Deleted

## 2020-04-04 ENCOUNTER — Ambulatory Visit (INDEPENDENT_AMBULATORY_CARE_PROVIDER_SITE_OTHER): Payer: Medicaid Other | Admitting: Gastroenterology

## 2020-04-04 ENCOUNTER — Other Ambulatory Visit: Payer: Self-pay

## 2020-04-04 ENCOUNTER — Encounter: Payer: Self-pay | Admitting: Gastroenterology

## 2020-04-04 VITALS — BP 109/47 | HR 104 | Temp 98.2°F | Ht 69.0 in | Wt 202.0 lb

## 2020-04-04 DIAGNOSIS — K219 Gastro-esophageal reflux disease without esophagitis: Secondary | ICD-10-CM

## 2020-04-04 DIAGNOSIS — R1319 Other dysphagia: Secondary | ICD-10-CM

## 2020-04-04 DIAGNOSIS — D649 Anemia, unspecified: Secondary | ICD-10-CM

## 2020-04-04 NOTE — Progress Notes (Signed)
Melodie Bouillon [address]  [address]  [place], [place] [address]  Main: [phone]  Fax: [phone]   Gastroenterology Consultation  Referring Provider:     Leanna Sato, MD Primary Care Physician:  Leanna Sato, MD Reason for Consultation:    Reflux        HPI:    Chief Complaint  Patient presents with  . Gastroesophageal Reflux    Madison Jenkins is a 25 y.o. y/o female referred for consultation & management  by Dr. Marvis Moeller, Doralee Albino, MD.  Patient reported daily severe reflux symptoms, including burning sensation in chest, that started 5 years ago when she was pregnant with her daughter.  It occurred again when she was pregnant with her son and was prescribed omeprazole twice daily that she was on for 5 to 6 months without any relief of her symptoms.  Therefore, the medication has been discontinued for the last 3 months that she continues to have symptoms daily.  She has never had an upper endoscopy.  Patient is also reporting solid food dysphagia for the last 3 months.  Reports nausea and intermittent vomiting as well.  No blood in stool.  Does not have any diarrhea.  States may not have a bowel movement for 2 to 4 days, but when she does go they are soft.  No family history of colon cancer.  No prior colonoscopy.  Records were personally reviewed, and patient has mild anemia.  Patient reports not having her period for at least a year now.  Past Medical History:  Diagnosis Date  . ADD (attention deficit disorder)    ADHD as per pt  . Asthma   . Depression   . Eczema   . Fatty liver   . Headache   . High cholesterol   . Murmur   . Orthostatic hypotension     Past Surgical History:  Procedure Laterality Date  . ADENOIDECTOMY    . TONSILLECTOMY AND ADENOIDECTOMY      Prior to Admission medications   Medication Sig Start Date End Date Taking? Authorizing Provider  levonorgestrel (LILETTA) 19.5 MCG/DAY IUD IUD by Intrauterine route.   Yes [provider]  Multiple Vitamin (MULTIVITAMIN) tablet Take 1 tablet by mouth daily.   Yes [provider]  omeprazole (PRILOSEC) 20 MG capsule Take 2 capsules by mouth daily. 03/20/20  Yes [provider]    Family History  Problem Relation Age of Onset  . Breast cancer Paternal Grandmother   . Cancer - Other Mother        ovarian  . Thyroid disease Mother   . Lupus Mother   . Graves' disease Mother      Social History   Tobacco Use  . Smoking status: Former Smoker    Packs/day: 0.50    Years: 0.50    Pack years: 0.25    Quit date: 05/06/2015    Years since quitting: 4.9  . Smokeless tobacco: Current User    Types: Chew  Vaping Use  . Vaping Use: Former  Substance Use Topics  . Alcohol use: Not Currently    Comment: occas  . Drug use: No    Allergies as of 04/04/2020 - Review Complete 04/04/2020  Allergen Reaction Noted  . Peanut-containing drug products  02/15/2018  . Penicillins Anaphylaxis 05/17/2014  . Shellfish allergy Hives and Shortness Of Breath 06/29/2018  . Sulfa antibiotics Anaphylaxis 11/27/2015  . Amoxicillin Rash 06/07/2014    Review of Systems:  All systems reviewed and negative except where noted in HPI.   Physical Exam:  BP (!) 109/47   Pulse (!) 104   Temp 98.2 F (36.8 C) (Oral)   Ht 5\' 9"  (1.753 m)   Wt 202 lb (91.6 kg)   BMI 29.83 kg/m  No LMP recorded. Psych:  Alert and cooperative. Normal mood and affect. General:   Alert,  Well-developed, well-nourished, pleasant and cooperative in NAD Head:  Normocephalic and atraumatic. Eyes:  Sclera clear, no icterus.   Conjunctiva pink. Ears:  Normal auditory acuity. Nose:  No deformity, discharge, or lesions. Mouth:  No deformity or lesions,oropharynx pink & moist. Neck:  Supple; no masses or thyromegaly. Abdomen:  Normal bowel sounds.  No bruits.  Soft, non-tender and non-distended without masses, hepatosplenomegaly or hernias noted.  No guarding or rebound tenderness.     Msk:  Symmetrical without gross deformities. Good, equal movement & strength bilaterally. Pulses:  Normal pulses noted. Extremities:  No clubbing or edema.  No cyanosis. Neurologic:  Alert and oriented x3;  grossly normal neurologically. Skin:  Intact without significant lesions or rashes. No jaundice. Lymph Nodes:  No significant cervical adenopathy. Psych:  Alert and cooperative. Normal mood and affect.   Labs: CBC    Component Value Date/Time   WBC 4.4 05/05/2019 1845   RBC 4.93 05/05/2019 1845   HGB 13.6 05/05/2019 1845   HGB 11.6 06/29/2018 1444   HCT 40.5 05/05/2019 1845   HCT 33.9 (L) 06/29/2018 1444   PLT 245 05/05/2019 1845   PLT 228 06/29/2018 1444   MCV 82.2 05/05/2019 1845   MCV 86 06/29/2018 1444   MCV 90 11/23/2013 1436   MCH 27.6 05/05/2019 1845   MCHC 33.6 05/05/2019 1845   RDW 13.2 05/05/2019 1845   RDW 12.3 06/29/2018 1444   RDW 13.0 11/23/2013 1436   LYMPHSABS 2.5 03/08/2016 1951   LYMPHSABS 2.3 11/23/2013 1436   MONOABS 0.5 03/08/2016 1951   MONOABS 0.7 11/23/2013 1436   EOSABS 0.3 03/08/2016 1951   EOSABS 0.3 11/23/2013 1436   BASOSABS 0.0 03/08/2016 1951   BASOSABS 0.1 11/23/2013 1436   CMP     Component Value Date/Time   NA 138 05/05/2019 1845   NA 139 03/01/2018 1100   NA 140 11/23/2013 1436   K 4.0 05/05/2019 1845   K 3.9 11/23/2013 1436   CL 104 05/05/2019 1845   CL 107 11/23/2013 1436   CO2 26 05/05/2019 1845   CO2 27 (H) 11/23/2013 1436   GLUCOSE 127 (H) 05/05/2019 1845   GLUCOSE 95 11/23/2013 1436   BUN 13 05/05/2019 1845   BUN 6 03/01/2018 1100   BUN 12 11/23/2013 1436   CREATININE 0.70 05/05/2019 1845   CREATININE 0.76 11/23/2013 1436   CALCIUM 9.4 05/05/2019 1845   CALCIUM 8.8 (L) 11/23/2013 1436   PROT 4.7 (L) 08/28/2018 0630   PROT 6.8 03/01/2018 1100   PROT 7.3 11/23/2013 1436   ALBUMIN 2.3 (L) 08/28/2018 0630   ALBUMIN 4.3 03/01/2018 1100   ALBUMIN 4.0 11/23/2013 1436   AST 19 08/28/2018 0630   AST 29 (H)  11/23/2013 1436   ALT 10 08/28/2018 0630   ALT 34 11/23/2013 1436   ALKPHOS 84 08/28/2018 0630   ALKPHOS 66 11/23/2013 1436   BILITOT 0.5 08/28/2018 0630   BILITOT 0.3 03/01/2018 1100   BILITOT 0.4 11/23/2013 1436   GFRNONAA >60 05/05/2019 1845   GFRNONAA >60 11/23/2013 1436   GFRNONAA >60 08/02/2013 2033   GFRAA >  60 05/05/2019 1845   GFRAA >60 11/23/2013 1436   GFRAA >60 08/02/2013 2033    Imaging Studies: No results found.  Assessment and Plan:   JAZMINA MUHLENKAMP is a 25 y.o. y/o female has been referred for reflux  Patient is reporting daily reflux symptoms even when she was on twice daily PPI for 5 to 6 months.  She is also having dysphagia and has never had an upper endoscopy  Therefore, given the above symptoms, EGD is indicated at this time  Alternative options of conservative management were discussed in detail, including but not limited to medication management, foregoing endoscopic procedures at this time and others.    I will also obtain further work-up of her anemia to see if she has iron deficiency.  Since she has not had her period in over a year, there is no explanation for her anemia at this time.  If iron deficiency present, colonoscopy would also be indicated  I have discussed alternative options, risks & benefits,  which include, but are not limited to, bleeding, infection, perforation,respiratory complication & drug reaction.  The patient agrees with this plan & written consent will be obtained.    Patient educated extensively on acid reflux lifestyle modification, including buying a bed wedge, not eating 3 hrs before bedtime, diet modifications, and handout given for the same.    Dr Melodie Bouillon  Speech recognition software was used to dictate the above note.

## 2020-04-05 LAB — CBC
Hematocrit: 42.9 % (ref 34.0–46.6)
Hemoglobin: 14.6 g/dL (ref 11.1–15.9)
MCH: 29.5 pg (ref 26.6–33.0)
MCHC: 34 g/dL (ref 31.5–35.7)
MCV: 87 fL (ref 79–97)
Platelets: 311 10*3/uL (ref 150–450)
RBC: 4.95 x10E6/uL (ref 3.77–5.28)
RDW: 12.9 % (ref 11.7–15.4)
WBC: 6.4 10*3/uL (ref 3.4–10.8)

## 2020-04-05 LAB — IRON AND TIBC
Iron Saturation: 14 % — ABNORMAL LOW (ref 15–55)
Iron: 51 ug/dL (ref 27–159)
Total Iron Binding Capacity: 366 ug/dL (ref 250–450)
UIBC: 315 ug/dL (ref 131–425)

## 2020-04-05 LAB — FERRITIN: Ferritin: 79 ng/mL (ref 15–150)

## 2020-04-08 ENCOUNTER — Other Ambulatory Visit: Payer: Medicaid Other | Attending: Gastroenterology

## 2020-04-09 ENCOUNTER — Telehealth: Payer: Self-pay

## 2020-04-09 ENCOUNTER — Telehealth: Payer: Self-pay | Admitting: Gastroenterology

## 2020-04-09 NOTE — Telephone Encounter (Signed)
Trish in Endo stated she attempted to call patient to advise of COVID test and was unable to get her.  Procedure is tomorrow with Dr. Maximino Greenland.  Thanks,  [place], [place]

## 2020-04-09 NOTE — Telephone Encounter (Signed)
Patient LVM to cancel procedure for tomorrow, her daughter is sick.

## 2020-04-09 NOTE — Telephone Encounter (Signed)
I tried calling patient's cell phone and it kept saying that at this time the call could not be completed as dial.I hope that the patient would be able to show up today and get her COVID-19 test.

## 2020-04-10 ENCOUNTER — Ambulatory Visit: Admission: RE | Admit: 2020-04-10 | Payer: Medicaid Other | Source: Home / Self Care | Admitting: Gastroenterology

## 2020-04-10 ENCOUNTER — Encounter: Admission: RE | Payer: Self-pay | Source: Home / Self Care

## 2020-04-10 SURGERY — ESOPHAGOGASTRODUODENOSCOPY (EGD) WITH PROPOFOL
Anesthesia: General

## 2020-04-10 NOTE — Telephone Encounter (Signed)
Called patient back and had to leave her a voicemail since she did not answer. Patient is to reschedule her EGD if she would like.

## 2020-04-17 ENCOUNTER — Telehealth: Payer: Self-pay

## 2020-04-17 DIAGNOSIS — D649 Anemia, unspecified: Secondary | ICD-10-CM

## 2020-04-17 NOTE — Telephone Encounter (Signed)
-----   Message from Pasty Spillers, MD sent at 04/12/2020  2:08 PM EDT ----- Kandis Cocking please see below my chart message.  Please place referral to Dr. Smith Robert for low iron saturation

## 2020-04-17 NOTE — Telephone Encounter (Signed)
Referral was sent to Dr. Smith Robert and they will contact the patient to schedule appointment.

## 2020-04-25 ENCOUNTER — Encounter: Payer: Self-pay | Admitting: Oncology

## 2020-04-25 ENCOUNTER — Inpatient Hospital Stay: Payer: Medicaid Other | Attending: Oncology | Admitting: Oncology

## 2020-04-25 ENCOUNTER — Ambulatory Visit: Payer: Medicaid Other | Admitting: Gastroenterology

## 2020-04-25 ENCOUNTER — Inpatient Hospital Stay: Payer: Medicaid Other

## 2020-04-25 VITALS — BP 124/81 | HR 109 | Temp 96.8°F | Resp 18 | Wt 205.7 lb

## 2020-04-25 DIAGNOSIS — R400 Somnolence: Secondary | ICD-10-CM | POA: Insufficient documentation

## 2020-04-25 DIAGNOSIS — Z87891 Personal history of nicotine dependence: Secondary | ICD-10-CM | POA: Diagnosis not present

## 2020-04-25 DIAGNOSIS — Z803 Family history of malignant neoplasm of breast: Secondary | ICD-10-CM | POA: Diagnosis not present

## 2020-04-25 DIAGNOSIS — R5383 Other fatigue: Secondary | ICD-10-CM | POA: Diagnosis not present

## 2020-04-25 DIAGNOSIS — R0683 Snoring: Secondary | ICD-10-CM | POA: Diagnosis not present

## 2020-04-25 DIAGNOSIS — Z79899 Other long term (current) drug therapy: Secondary | ICD-10-CM | POA: Insufficient documentation

## 2020-04-25 DIAGNOSIS — K219 Gastro-esophageal reflux disease without esophagitis: Secondary | ICD-10-CM | POA: Diagnosis not present

## 2020-04-25 DIAGNOSIS — E611 Iron deficiency: Secondary | ICD-10-CM | POA: Diagnosis present

## 2020-04-25 LAB — COMPREHENSIVE METABOLIC PANEL
ALT: 34 U/L (ref 0–44)
AST: 28 U/L (ref 15–41)
Albumin: 4.5 g/dL (ref 3.5–5.0)
Alkaline Phosphatase: 56 U/L (ref 38–126)
Anion gap: 9 (ref 5–15)
BUN: 10 mg/dL (ref 6–20)
CO2: 24 mmol/L (ref 22–32)
Calcium: 9.4 mg/dL (ref 8.9–10.3)
Chloride: 105 mmol/L (ref 98–111)
Creatinine, Ser: 0.77 mg/dL (ref 0.44–1.00)
GFR, Estimated: >60 mL/min (ref >60)
Glucose, Bld: 120 mg/dL — ABNORMAL HIGH (ref 70–99)
Potassium: 4.3 mmol/L (ref 3.5–5.1)
Sodium: 138 mmol/L (ref 135–145)
Total Bilirubin: 0.7 mg/dL (ref 0.3–1.2)
Total Protein: 7.8 g/dL (ref 6.5–8.1)

## 2020-04-25 LAB — IRON AND TIBC
Iron: 66 ug/dL (ref 28–170)
Saturation Ratios: 16 % (ref 10.4–31.8)
TIBC: 412 ug/dL (ref 250–450)
UIBC: 346 ug/dL

## 2020-04-25 LAB — FERRITIN: Ferritin: 30 ng/mL (ref 11–307)

## 2020-04-25 LAB — CBC WITH DIFFERENTIAL/PLATELET
Abs Immature Granulocytes: 0.07 10*3/uL (ref 0.00–0.07)
Basophils Absolute: 0.1 10*3/uL (ref 0.0–0.1)
Basophils Relative: 1 %
Eosinophils Absolute: 0.4 10*3/uL (ref 0.0–0.5)
Eosinophils Relative: 5 %
HCT: 42 % (ref 36.0–46.0)
Hemoglobin: 14.4 g/dL (ref 12.0–15.0)
Immature Granulocytes: 1 %
Lymphocytes Relative: 40 %
Lymphs Abs: 3.2 10*3/uL (ref 0.7–4.0)
MCH: 29.6 pg (ref 26.0–34.0)
MCHC: 34.3 g/dL (ref 30.0–36.0)
MCV: 86.2 fL (ref 80.0–100.0)
Monocytes Absolute: 0.7 10*3/uL (ref 0.1–1.0)
Monocytes Relative: 9 %
Neutro Abs: 3.6 10*3/uL (ref 1.7–7.7)
Neutrophils Relative %: 44 %
Platelets: 213 10*3/uL (ref 150–400)
RBC: 4.87 MIL/uL (ref 3.87–5.11)
RDW: 12.5 % (ref 11.5–15.5)
WBC: 8.1 10*3/uL (ref 4.0–10.5)
nRBC: 0 % (ref 0.0–0.2)

## 2020-04-25 LAB — TECHNOLOGIST SMEAR REVIEW: Plt Morphology: NORMAL

## 2020-04-25 LAB — RETIC PANEL
Immature Retic Fract: 11.6 % (ref 2.3–15.9)
RBC.: 4.85 MIL/uL (ref 3.87–5.11)
Retic Count, Absolute: 110.1 10*3/uL (ref 19.0–186.0)
Retic Ct Pct: 2.3 % (ref 0.4–3.1)
Reticulocyte Hemoglobin: 34.2 pg (ref >27.9)

## 2020-04-25 NOTE — Progress Notes (Signed)
Patient here to establish care for anemia 

## 2020-04-25 NOTE — Progress Notes (Signed)
Hematology/Oncology Consult note [facility] Telephone:([phone] [phone]   Patient Care Team: Leanna Sato, MD as PCP - General (Family Medicine) Iran Ouch, MD as PCP - Cardiology (Cardiology)  REFERRING PROVIDER: Leanna Sato, MD  CHIEF COMPLAINTS/REASON FOR VISIT:  Evaluation of anemia  HISTORY OF PRESENTING ILLNESS:  Madison Jenkins is a  25 y.o.  female with PMH listed below who was referred to me for evaluation of anemia Reviewed patient's recent labs that was done.  04/04/2020 labs revealed anemia with hemoglobin of 14.6.  Iron 51, TIBC 366, ferritin 79. Reviewed patient's previous labs  Patient has a history of anemia in 2020 when her hemoglobin was 8.3.  Patient reports that was after she gave birth to her son.  Patient has been on oral iron supplementation until end of February 2022. She recently was referred to establish care with gastroenterology for evaluation of severe acid reflux symptoms.  Patient takes PPI without much of relief of symptoms.  She reports that her stool is dark but no bright red blood. Patient has IUD, LMP reported to be October 2021 Gastroenterology recommends EGD evaluation.  Patient was also referred to see me for history of anemia.  Former smoker.  She reports feeling tired and drowsy during the day.  She does not feel refreshed when she wakes up in the morning.  Husband has noticed her snoring and possible breathing interruptions.   Review of Systems  Constitutional: Positive for fatigue. Negative for appetite change, chills and fever.  HENT:   Negative for hearing loss and voice change.   Eyes: Negative for eye problems.  Respiratory: Negative for chest tightness and cough.   Cardiovascular: Negative for chest pain.  Gastrointestinal: Negative for abdominal distention, abdominal pain and blood in stool.  Endocrine: Negative for hot flashes.  Genitourinary: Negative for difficulty urinating and  frequency.   Musculoskeletal: Negative for arthralgias.  Skin: Negative for itching and rash.  Neurological: Negative for extremity weakness.  Hematological: Negative for adenopathy.  Psychiatric/Behavioral: Negative for confusion.     MEDICAL HISTORY:  Past Medical History:  Diagnosis Date  . ADD (attention deficit disorder)    ADHD as per pt  . Asthma   . Depression   . Eczema   . Fatty liver   . Headache   . High cholesterol   . Murmur   . Orthostatic hypotension     SURGICAL HISTORY: Past Surgical History:  Procedure Laterality Date  . ADENOIDECTOMY    . TONSILLECTOMY AND ADENOIDECTOMY      SOCIAL HISTORY: Social History   Socioeconomic History  . Marital status: Married    Spouse name: [place]   . Number of children: Not on file  . Years of education: Not on file  . Highest education level: Not on file  Occupational History  . Not on file  Tobacco Use  . Smoking status: Former Smoker    Packs/day: 0.50    Years: 0.50    Pack years: 0.25    Quit date: 05/06/2015    Years since quitting: 4.9  . Smokeless tobacco: Current User    Types: Chew  Vaping Use  . Vaping Use: Former  Substance and Sexual Activity  . Alcohol use: Not Currently    Comment: occas  . Drug use: No  . Sexual activity: Yes    Birth control/protection: Pill    Comment: planning pill  Other Topics Concern  . Not on file  Social History Narrative  .  Not on file   Social Determinants of Health   Financial Resource Strain: Not on file  Food Insecurity: Not on file  Transportation Needs: Not on file  Physical Activity: Not on file  Stress: Not on file  Social Connections: Not on file  Intimate Partner Violence: Not on file    FAMILY HISTORY: Family History  Problem Relation Age of Onset  . Breast cancer Paternal Grandmother   . Cancer - Other Mother        ovarian  . Thyroid disease Mother   . Lupus Mother   . Graves' disease Mother   . Heart attack Father   .  Hypertension Father   . Colon cancer Maternal Grandfather   . Colon cancer Paternal Grandfather     ALLERGIES:  is allergic to peanut-containing drug products, penicillins, shellfish allergy, sulfa antibiotics, and amoxicillin.  MEDICATIONS:  Current Outpatient Medications  Medication Sig Dispense Refill  . levonorgestrel (LILETTA) 19.5 MCG/DAY IUD IUD by Intrauterine route.    . Multiple Vitamin (MULTIVITAMIN) tablet Take 1 tablet by mouth daily.    Marland Kitchen omeprazole (PRILOSEC) 20 MG capsule Take 2 capsules by mouth daily. (Patient not taking: Reported on 04/25/2020)     No current facility-administered medications for this visit.     PHYSICAL EXAMINATION: ECOG PERFORMANCE STATUS: 0 - Asymptomatic Vitals:   04/25/20 1341  BP: 124/81  Pulse: (!) 109  Resp: 18  Temp: (!) 96.8 F (36 C)   Filed Weights   04/25/20 1341  Weight: 205 lb 11.2 oz (93.3 kg)    Physical Exam Constitutional:      General: She is not in acute distress. HENT:     Head: Normocephalic and atraumatic.  Eyes:     General: No scleral icterus. Cardiovascular:     Rate and Rhythm: Normal rate and regular rhythm.     Heart sounds: Normal heart sounds.  Pulmonary:     Effort: Pulmonary effort is normal. No respiratory distress.     Breath sounds: No wheezing.  Abdominal:     General: Bowel sounds are normal. There is no distension.     Palpations: Abdomen is soft.  Musculoskeletal:        General: No deformity. Normal range of motion.     Cervical back: Normal range of motion and neck supple.  Skin:    General: Skin is warm and dry.     Findings: No erythema or rash.  Neurological:     Mental Status: She is alert and oriented to person, place, and time. Mental status is at baseline.     Cranial Nerves: No cranial nerve deficit.     Coordination: Coordination normal.  Psychiatric:        Mood and Affect: Mood normal.      LABORATORY DATA:  I have reviewed the data as listed Lab Results   Component Value Date   WBC 6.4 04/04/2020   HGB 14.6 04/04/2020   HCT 42.9 04/04/2020   MCV 87 04/04/2020   PLT 311 04/04/2020   Recent Labs    05/05/19 1845  NA 138  K 4.0  CL 104  CO2 26  GLUCOSE 127*  BUN 13  CREATININE 0.70  CALCIUM 9.4  GFRNONAA >60  GFRAA >60   Iron/TIBC/Ferritin/ %Sat    Component Value Date/Time   IRON 51 04/04/2020 1444   TIBC 366 04/04/2020 1444   FERRITIN 79 04/04/2020 1444   IRONPCTSAT 14 (L) 04/04/2020 1444  ASSESSMENT & PLAN:  1. Iron deficiency   2. Other fatigue    Iron deficiency, without anemia.  Hemoglobin is at high normal end. Iron saturation is mildly decreased. Discussed with patient that etiology of iron deficiency includes chronic blood loss, overproduction of bone marrow, etc. Patient has severe acid reflux and I agree with gastroenterologist recommendation of EGD for further evaluation. She may have chronic blood loss due to her gastropathy.  No anemia, hemoglobin is indeed at high normal end.  I will repeat her CBC, iron TIBC ferritin since she has been off oral iron supplementation for 4 to 6 weeks. I will also check erythropoietin, JAK2 with reflex.   Fatigue, out of proportion to her hemoglobin level.  Her clinical symptoms suggest a possible underlying sleep apnea.  Recommend patient to discuss with primary care provider to have sleep study evaluation.   Orders Placed This Encounter  Procedures  . CBC with Differential/Platelet    Standing Status:   Future    Standing Expiration Date:   04/25/2021  . Technologist smear review    Standing Status:   Future    Standing Expiration Date:   04/25/2021  . Iron and TIBC    Standing Status:   Future    Standing Expiration Date:   04/25/2021  . Ferritin    Standing Status:   Future    Standing Expiration Date:   04/25/2021  . Retic Panel    Standing Status:   Future    Standing Expiration Date:   04/25/2021  . JAK2 V617F, w Reflex to CALR/E12/MPL    Standing  Status:   Future    Standing Expiration Date:   04/25/2021  . Erythropoietin    Standing Status:   Future    Standing Expiration Date:   04/25/2021  . Comprehensive metabolic panel    Standing Status:   Future    Standing Expiration Date:   04/25/2021    All questions were answered. The patient knows to call the clinic with any problems questions or concerns. Cc. Leanna Sato, MD  Return of visit: To be determined Thank you for this kind referral and the opportunity to participate in the care of this patient. A copy of today's note is routed to referring provider   Rickard Patience, MD, PhD 04/25/2020

## 2020-04-26 LAB — ERYTHROPOIETIN: Erythropoietin: 7.8 m[IU]/mL (ref 2.6–18.5)

## 2020-05-08 ENCOUNTER — Encounter: Payer: Self-pay | Admitting: Oncology

## 2020-05-08 LAB — CALR + JAK2 E12-15 + MPL (REFLEXED)

## 2020-05-08 LAB — JAK2 V617F, W REFLEX TO CALR/E12/MPL

## 2020-05-10 ENCOUNTER — Other Ambulatory Visit: Payer: Self-pay

## 2020-05-10 ENCOUNTER — Encounter: Payer: Self-pay | Admitting: Oncology

## 2020-05-10 ENCOUNTER — Inpatient Hospital Stay: Payer: Medicaid Other | Attending: Oncology | Admitting: Oncology

## 2020-05-10 DIAGNOSIS — E611 Iron deficiency: Secondary | ICD-10-CM | POA: Insufficient documentation

## 2020-05-10 DIAGNOSIS — Z79899 Other long term (current) drug therapy: Secondary | ICD-10-CM | POA: Insufficient documentation

## 2020-05-10 DIAGNOSIS — Z87891 Personal history of nicotine dependence: Secondary | ICD-10-CM | POA: Insufficient documentation

## 2020-05-10 DIAGNOSIS — Z793 Long term (current) use of hormonal contraceptives: Secondary | ICD-10-CM | POA: Insufficient documentation

## 2020-05-10 NOTE — Progress Notes (Signed)
Patient denies new problems/concerns today.   °

## 2020-05-11 NOTE — Progress Notes (Signed)
HEMATOLOGY-ONCOLOGY TeleHEALTH VISIT PROGRESS NOTE  I connected with Madison Jenkins on 05/11/20  at  3:30 PM EDT by video enabled telemedicine visit and verified that I am speaking with the correct person using two identifiers. I discussed the limitations, risks, security and privacy concerns of performing an evaluation and management service by telemedicine and the availability of in-person appointments. The patient expressed understanding and agreed to proceed.   Other persons participating in the visit and their role in the encounter:  None  Patient's location: Home  Provider's location: office Chief Complaint: anmeia   INTERVAL HISTORY Madison Jenkins is a 25 y.o. female who has above history reviewed by me today presents for follow up visit for anemia She had blood work done and presents virtually to discuss results and questions.   Review of Systems  Constitutional: Positive for fatigue. Negative for appetite change, chills and fever.  HENT:   Negative for hearing loss and voice change.   Eyes: Negative for eye problems.  Respiratory: Negative for chest tightness and cough.   Cardiovascular: Negative for chest pain.  Gastrointestinal: Negative for abdominal distention, abdominal pain and blood in stool.  Endocrine: Negative for hot flashes.  Genitourinary: Negative for difficulty urinating and frequency.   Musculoskeletal: Negative for arthralgias.  Skin: Negative for itching and rash.  Neurological: Negative for extremity weakness.  Hematological: Negative for adenopathy.  Psychiatric/Behavioral: Negative for confusion.    Past Medical History:  Diagnosis Date  . ADD (attention deficit disorder)    ADHD as per pt  . Asthma   . Depression   . Eczema   . Fatty liver   . Headache   . High cholesterol   . Murmur   . Orthostatic hypotension    Past Surgical History:  Procedure Laterality Date  . ADENOIDECTOMY    . TONSILLECTOMY AND ADENOIDECTOMY      Family History   Problem Relation Age of Onset  . Breast cancer Paternal Grandmother   . Cancer - Other Mother        ovarian  . Thyroid disease Mother   . Lupus Mother   . Graves' disease Mother   . Heart attack Father   . Hypertension Father   . Colon cancer Maternal Grandfather   . Colon cancer Paternal Grandfather     Social History   Socioeconomic History  . Marital status: Married    Spouse name: [place]   . Number of children: Not on file  . Years of education: Not on file  . Highest education level: Not on file  Occupational History  . Not on file  Tobacco Use  . Smoking status: Former Smoker    Packs/day: 0.50    Years: 0.50    Pack years: 0.25    Quit date: 05/06/2015    Years since quitting: 5.0  . Smokeless tobacco: Current User    Types: Chew  Vaping Use  . Vaping Use: Former  Substance and Sexual Activity  . Alcohol use: Not Currently    Comment: occas  . Drug use: No  . Sexual activity: Yes    Birth control/protection: Pill    Comment: planning pill  Other Topics Concern  . Not on file  Social History Narrative  . Not on file   Social Determinants of Health   Financial Resource Strain: Not on file  Food Insecurity: Not on file  Transportation Needs: Not on file  Physical Activity: Not on file  Stress: Not on file  Social  Connections: Not on file  Intimate Partner Violence: Not on file    Current Outpatient Medications on File Prior to Visit  Medication Sig Dispense Refill  . levonorgestrel (LILETTA) 19.5 MCG/DAY IUD IUD by Intrauterine route.    . Multiple Vitamin (MULTIVITAMIN) tablet Take 1 tablet by mouth daily.    Marland Kitchen omeprazole (PRILOSEC) 20 MG capsule Take 2 capsules by mouth daily. (Patient not taking: No sig reported)     No current facility-administered medications on file prior to visit.    Allergies  Allergen Reactions  . Peanut-Containing Drug Products   . Penicillins Anaphylaxis  . Shellfish Allergy Hives and Shortness Of Breath  . Sulfa  Antibiotics Anaphylaxis  . Amoxicillin Rash       Observations/Objective: Today's Vitals   05/10/20 1403  PainSc: 0-No pain   There is no height or weight on file to calculate BMI.  Physical Exam Neurological:     Mental Status: She is alert.     CBC    Component Value Date/Time   WBC 8.1 04/25/2020 1422   RBC 4.85 04/25/2020 1422   RBC 4.87 04/25/2020 1422   HGB 14.4 04/25/2020 1422   HGB 14.6 04/04/2020 1444   HCT 42.0 04/25/2020 1422   HCT 42.9 04/04/2020 1444   PLT 213 04/25/2020 1422   PLT 311 04/04/2020 1444   MCV 86.2 04/25/2020 1422   MCV 87 04/04/2020 1444   MCV 90 11/23/2013 1436   MCH 29.6 04/25/2020 1422   MCHC 34.3 04/25/2020 1422   RDW 12.5 04/25/2020 1422   RDW 12.9 04/04/2020 1444   RDW 13.0 11/23/2013 1436   LYMPHSABS 3.2 04/25/2020 1422   LYMPHSABS 2.3 11/23/2013 1436   MONOABS 0.7 04/25/2020 1422   MONOABS 0.7 11/23/2013 1436   EOSABS 0.4 04/25/2020 1422   EOSABS 0.3 11/23/2013 1436   BASOSABS 0.1 04/25/2020 1422   BASOSABS 0.1 11/23/2013 1436    CMP     Component Value Date/Time   NA 138 04/25/2020 1422   NA 139 03/01/2018 1100   NA 140 11/23/2013 1436   K 4.3 04/25/2020 1422   K 3.9 11/23/2013 1436   CL 105 04/25/2020 1422   CL 107 11/23/2013 1436   CO2 24 04/25/2020 1422   CO2 27 (H) 11/23/2013 1436   GLUCOSE 120 (H) 04/25/2020 1422   GLUCOSE 95 11/23/2013 1436   BUN 10 04/25/2020 1422   BUN 6 03/01/2018 1100   BUN 12 11/23/2013 1436   CREATININE 0.77 04/25/2020 1422   CREATININE 0.76 11/23/2013 1436   CALCIUM 9.4 04/25/2020 1422   CALCIUM 8.8 (L) 11/23/2013 1436   PROT 7.8 04/25/2020 1422   PROT 6.8 03/01/2018 1100   PROT 7.3 11/23/2013 1436   ALBUMIN 4.5 04/25/2020 1422   ALBUMIN 4.3 03/01/2018 1100   ALBUMIN 4.0 11/23/2013 1436   AST 28 04/25/2020 1422   AST 29 (H) 11/23/2013 1436   ALT 34 04/25/2020 1422   ALT 34 11/23/2013 1436   ALKPHOS 56 04/25/2020 1422   ALKPHOS 66 11/23/2013 1436   BILITOT 0.7 04/25/2020  1422   BILITOT 0.3 03/01/2018 1100   BILITOT 0.4 11/23/2013 1436   GFRNONAA >60 04/25/2020 1422   GFRNONAA >60 11/23/2013 1436   GFRNONAA >60 08/02/2013 2033   GFRAA >60 05/05/2019 1845   GFRAA >60 11/23/2013 1436   GFRAA >60 08/02/2013 2033     Assessment and Plan: 1. Iron deficiency     Labs are reviewed and discussed with patient. Cbc showed normal  hemoglobin.  Iron panel showed normal TIBC, iron saturation, ferritin. Not consistent with iron deficiency.  JAK2 V617F mutation negative, with reflex to other mutations CALR, MPL, JAK 2 Ex 12-15 mutations negative. Normal erythropoietin.  No need for further work up.  Patient opts to follow up with PCP. If new concerns of iron deficiency/anemia, she will call cancer and reschedule.  Follow Up Instructions: Discharge.   I discussed the assessment and treatment plan with the patient. The patient was provided an opportunity to ask questions and all were answered. The patient agreed with the plan and demonstrated an understanding of the instructions.  The patient was advised to call back or seek an in-person evaluation if the symptoms worsen or if the condition fails to improve as anticipated.   Rickard Patience, MD 05/11/2020 2:34 PM

## 2021-04-03 IMAGING — CR DG CHEST 2V
1 series · 2 of 2 positions shown · non-contrast
Comparison: None.

CLINICAL DATA: Chest pain. Midsternal pain radiating to the back.
COVID positive 2 days ago.

EXAM:
CHEST - 2 VIEW

[Series 1: dg chest 2 view · 0.14mm/px · 2 of 2 slices shown]
[im 1/2]
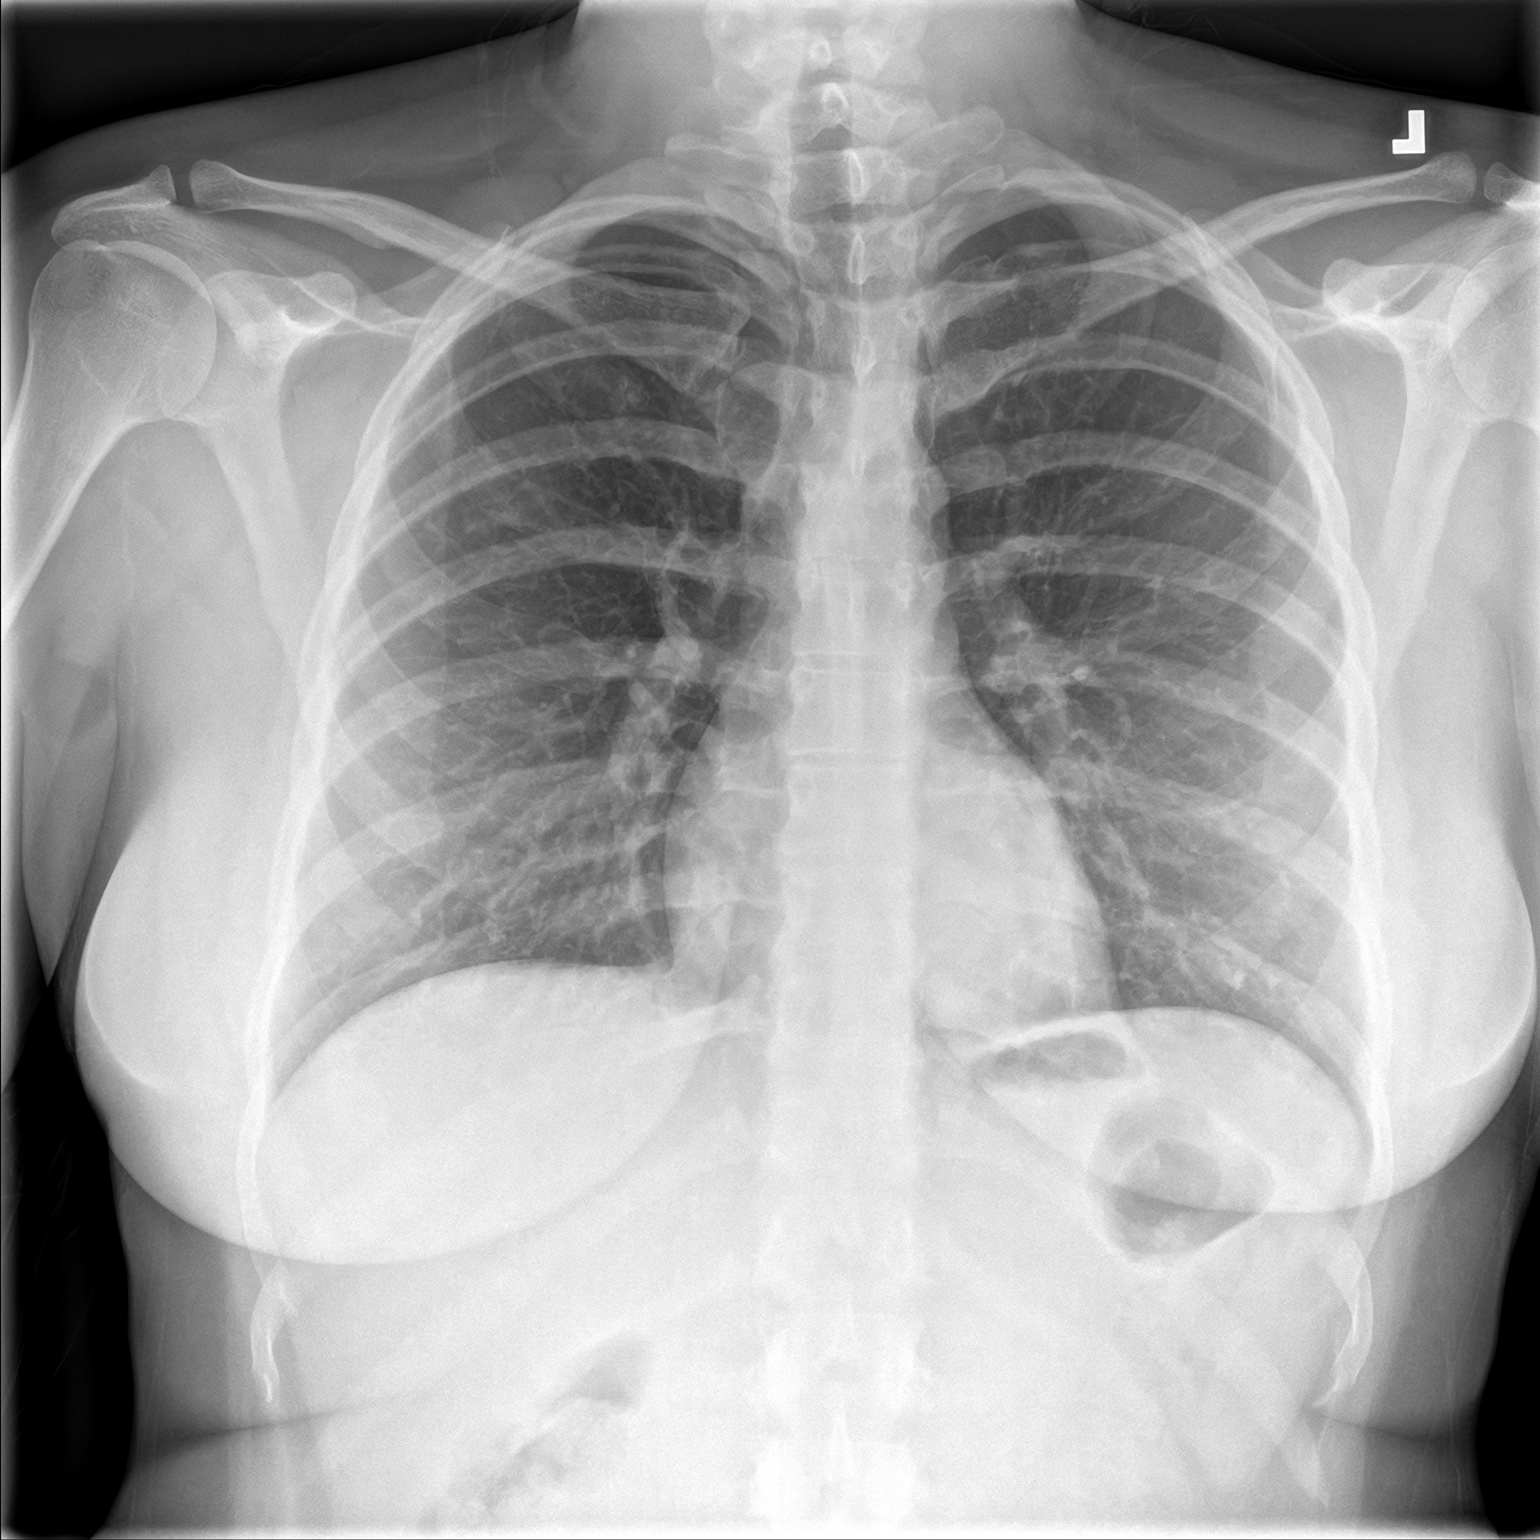
[im 2/2]
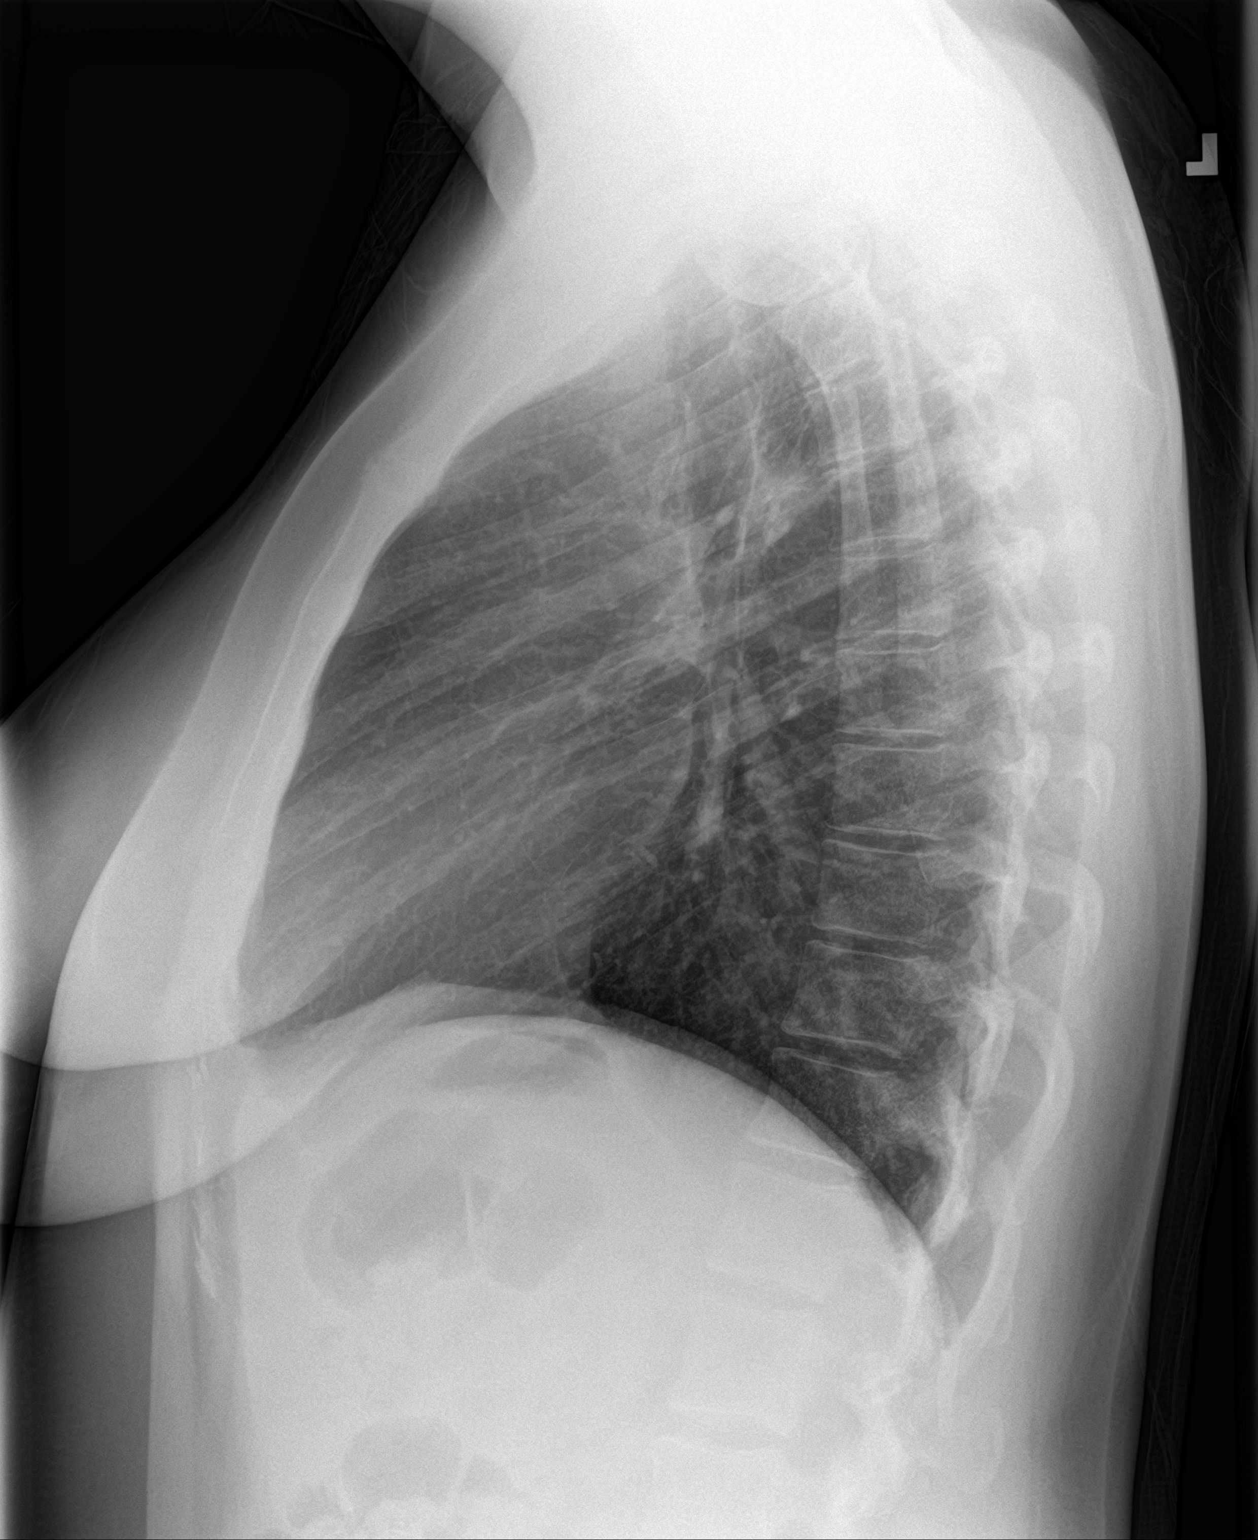

[2 of 2 positions shown; findings below may reference images not displayed]

FINDINGS: The cardiomediastinal contours are normal. Minor vague bibasilar
opacities. Pulmonary vasculature is normal. No confluent airspace
disease, pleural effusion, or pneumothorax. No acute osseous
abnormalities are seen.
IMPRESSION: Minor vague bibasilar opacities, likely atelectasis versus COVID
pneumonia.
